# Patient Record
Sex: Male | Born: 1970 | Race: White | Hispanic: No | Smoking: Never smoker
Health system: Southern US, Community
[De-identification: ages and names within clinical notes are randomized; demographics above are authoritative.]

## PROBLEM LIST (undated history)

## (undated) DIAGNOSIS — M1990 Unspecified osteoarthritis, unspecified site: Secondary | ICD-10-CM

## (undated) DIAGNOSIS — F419 Anxiety disorder, unspecified: Secondary | ICD-10-CM

## (undated) DIAGNOSIS — K219 Gastro-esophageal reflux disease without esophagitis: Secondary | ICD-10-CM

## (undated) DIAGNOSIS — I10 Essential (primary) hypertension: Secondary | ICD-10-CM

## (undated) DIAGNOSIS — K5190 Ulcerative colitis, unspecified, without complications: Secondary | ICD-10-CM

## (undated) DIAGNOSIS — G4730 Sleep apnea, unspecified: Secondary | ICD-10-CM

## (undated) HISTORY — DX: Essential (primary) hypertension: I10

## (undated) HISTORY — DX: Anxiety disorder, unspecified: F41.9

## (undated) HISTORY — PX: TOOTH EXTRACTION: SUR596

## (undated) HISTORY — PX: CARDIAC CATHETERIZATION: SHX172

## (undated) HISTORY — DX: Sleep apnea, unspecified: G47.30

## (undated) HISTORY — PX: TONSILLECTOMY AND ADENOIDECTOMY: SUR1326

---

## 2009-07-21 ENCOUNTER — Encounter: Payer: Self-pay | Admitting: Cardiology

## 2009-07-31 ENCOUNTER — Encounter: Payer: Self-pay | Admitting: Cardiology

## 2009-08-21 ENCOUNTER — Emergency Department (HOSPITAL_COMMUNITY): Admission: EM | Admit: 2009-08-21 | Discharge: 2009-08-21 | Payer: Self-pay | Admitting: Emergency Medicine

## 2009-08-21 ENCOUNTER — Encounter: Payer: Self-pay | Admitting: Cardiology

## 2009-09-11 ENCOUNTER — Emergency Department (HOSPITAL_COMMUNITY): Admission: EM | Admit: 2009-09-11 | Discharge: 2009-09-11 | Payer: Self-pay | Admitting: Emergency Medicine

## 2009-09-16 ENCOUNTER — Emergency Department (HOSPITAL_COMMUNITY)
Admission: EM | Admit: 2009-09-16 | Discharge: 2009-09-16 | Payer: Self-pay | Source: Home / Self Care | Admitting: Emergency Medicine

## 2009-09-25 ENCOUNTER — Encounter: Payer: Self-pay | Admitting: Cardiology

## 2009-10-01 ENCOUNTER — Encounter: Payer: Self-pay | Admitting: Cardiology

## 2009-10-06 ENCOUNTER — Encounter: Payer: Self-pay | Admitting: Cardiology

## 2009-10-06 ENCOUNTER — Ambulatory Visit: Payer: Self-pay | Admitting: Cardiology

## 2009-10-06 ENCOUNTER — Emergency Department (HOSPITAL_COMMUNITY): Admission: EM | Admit: 2009-10-06 | Discharge: 2009-10-06 | Payer: Self-pay | Admitting: Emergency Medicine

## 2009-10-19 ENCOUNTER — Encounter (INDEPENDENT_AMBULATORY_CARE_PROVIDER_SITE_OTHER): Payer: Self-pay | Admitting: *Deleted

## 2009-10-19 ENCOUNTER — Ambulatory Visit: Payer: Self-pay | Admitting: Cardiology

## 2009-10-19 DIAGNOSIS — R1013 Epigastric pain: Secondary | ICD-10-CM | POA: Insufficient documentation

## 2009-10-19 DIAGNOSIS — I498 Other specified cardiac arrhythmias: Secondary | ICD-10-CM | POA: Insufficient documentation

## 2009-10-19 DIAGNOSIS — R072 Precordial pain: Secondary | ICD-10-CM | POA: Insufficient documentation

## 2009-10-19 DIAGNOSIS — F411 Generalized anxiety disorder: Secondary | ICD-10-CM | POA: Insufficient documentation

## 2009-10-20 ENCOUNTER — Encounter: Payer: Self-pay | Admitting: Cardiology

## 2009-10-25 ENCOUNTER — Encounter: Payer: Self-pay | Admitting: Cardiology

## 2009-10-25 ENCOUNTER — Ambulatory Visit: Payer: Self-pay | Admitting: Cardiology

## 2009-10-26 ENCOUNTER — Ambulatory Visit: Payer: Self-pay | Admitting: Cardiology

## 2009-10-26 ENCOUNTER — Ambulatory Visit (HOSPITAL_COMMUNITY): Admission: RE | Admit: 2009-10-26 | Discharge: 2009-10-26 | Payer: Self-pay | Admitting: Cardiology

## 2009-11-08 ENCOUNTER — Ambulatory Visit: Payer: Self-pay | Admitting: Cardiology

## 2009-11-11 ENCOUNTER — Encounter: Payer: Self-pay | Admitting: Cardiology

## 2009-11-20 ENCOUNTER — Emergency Department (HOSPITAL_COMMUNITY): Admission: EM | Admit: 2009-11-20 | Discharge: 2009-11-20 | Payer: Self-pay | Admitting: Emergency Medicine

## 2009-11-22 ENCOUNTER — Ambulatory Visit: Payer: Self-pay | Admitting: Cardiology

## 2009-12-21 ENCOUNTER — Ambulatory Visit: Payer: Self-pay | Admitting: Cardiology

## 2009-12-24 ENCOUNTER — Ambulatory Visit: Payer: Self-pay | Admitting: Cardiology

## 2009-12-24 ENCOUNTER — Ambulatory Visit (HOSPITAL_COMMUNITY): Admission: RE | Admit: 2009-12-24 | Discharge: 2009-12-24 | Payer: Self-pay | Admitting: Cardiology

## 2010-01-06 ENCOUNTER — Encounter: Admission: RE | Admit: 2010-01-06 | Discharge: 2010-01-06 | Payer: Self-pay | Admitting: Gastroenterology

## 2010-01-09 ENCOUNTER — Emergency Department (HOSPITAL_COMMUNITY): Admission: EM | Admit: 2010-01-09 | Discharge: 2010-01-09 | Payer: Self-pay | Admitting: Emergency Medicine

## 2010-01-11 ENCOUNTER — Encounter: Payer: Self-pay | Admitting: Gastroenterology

## 2010-01-14 ENCOUNTER — Encounter: Admission: RE | Admit: 2010-01-14 | Discharge: 2010-01-14 | Payer: Self-pay | Admitting: Gastroenterology

## 2010-01-24 ENCOUNTER — Encounter: Admission: RE | Admit: 2010-01-24 | Discharge: 2010-01-24 | Payer: Self-pay | Admitting: Gastroenterology

## 2010-02-09 ENCOUNTER — Ambulatory Visit (HOSPITAL_COMMUNITY): Admission: RE | Admit: 2010-02-09 | Discharge: 2010-02-09 | Payer: Self-pay | Admitting: Gastroenterology

## 2010-03-06 ENCOUNTER — Emergency Department (HOSPITAL_COMMUNITY)
Admission: EM | Admit: 2010-03-06 | Discharge: 2010-03-06 | Payer: Self-pay | Source: Home / Self Care | Admitting: Emergency Medicine

## 2010-05-02 ENCOUNTER — Ambulatory Visit (HOSPITAL_COMMUNITY)
Admission: RE | Admit: 2010-05-02 | Discharge: 2010-05-02 | Payer: Self-pay | Source: Home / Self Care | Attending: Cardiovascular Disease | Admitting: Cardiovascular Disease

## 2010-05-12 NOTE — Op Note (Addendum)
NAME:  Jimmy Durham, Jimmy Durham NO.:  [id number]  MEDICAL RECORD NO.:  [id number]           PATIENT TYPE:  LOCATION:                                 FACILITY:  PHYSICIAN:  Thurmon Fair, MD     DATE OF BIRTH:  03/21/1971  DATE OF PROCEDURE: DATE OF DISCHARGE:                              OPERATIVE REPORT   PREPROCEDURAL DIAGNOSIS:  Recurrent presyncope.  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  PROCEDURE PERFORMED:  Head-up tilt table testing with administration of sublingual nitroglycerin.  SURGEON:  Thurmon Fair, MD  Jimmy Durham is a 40 year old man with multiple complaints including recurrent episodes of dizziness with loss of vision and near-syncope.  The patient was initially monitored in the supine position until achievement of hemodynamic steady state, at which point the blood pressure was 139/93 mmHg and the heart rate was 73 beats per minute. His blood pressure was consistently elevated throughout the supine monitoring.  The patient was then raised to the 70-degree head-up tilt position.  The immediate blood pressure was 139/93 mmHg and the heart rate was 73 beats per minute.  There was no evidence of orthostatic hypotension.  The patient was then monitored in the upright position for about 30 minutes. During this time, there were profound variations in heart rate between 67 and 88 beats per minute with obvious respiratory arrhythmia consistent with high vagal tone.  The blood pressure showed a gradual reduction during the upright position to a minimum of 113/78 mmHg.  He did not have any complaints during this period.  Carotid sinus compression was performed in a sequential fashion on the right and left side.  There was a slight and expected reduction in heart rate with rapid return to baseline levels.  The patient did not complain of any  presyncopal symptoms.  Sublingual nitroglycerin 0.4 mg was then administered.  The patient showed a gradual increase in heart rate to a maximum of 116 beats per minute and a slight decrease in blood pressure to around 109/56 mmHg. He began complaining of sweatiness, flushing, and warmth, and subsequently complained of blackening of his vision.  He subsequently experienced complete brief syncope.  After peeking at a heart rate of 116, he showed a fairly abrupt reduction in heart rate with sinus bradycardia to the range of 40 beats per minute followed by episodes of second-degree atrioventricular block with junctional escape rhythm.  The longest pause recorded was approximately 4.5 seconds and was associated with full-blown syncope.  The lowest blood pressure recorded during this time was 72/27 mmHg.  The patient was returned to the supine position with virtually instantaneous resolution of loss of consciousness.  The blood pressure increased immediately to 105/68 mmHg and the heart rate stayed relatively low around 60 beats per minute for a period of time.  The patient had profound diaphoresis.  He stated that the symptoms he experienced were reminiscent of many, although not all of his clinical events.  CONCLUSION:  Abnormal head-up tilt table test with findings highly consistent with neurally mediated syncope, predominantly with a cardioinhibitory component.  There is also a component of resting hypertension and vasodepressor response.  There is no evidence of immediate orthostatic hypotension.  Treatment should consist of volume and felt loading, but due to the typical rise and fall heart rate he would hopefully have a good response to beta-blockers.  Atenolol 25 mg once a day was prescribed.  It should also help with treatment of systemic hypertension.  Jimmy Durham has numerous other somatic complaints that appeared to not be associated with this diagnosis, it will probably take  [place] some time to sort through his numerous symptoms.     Thurmon Fair, MD     MC/MEDQ  D:  05/03/2010  T:  05/03/2010  Job:  [id number]  cc:   [facility].  Electronically Signed by Thurmon Fair M.D. on 05/12/2010 12:18:06 PM

## 2010-05-17 NOTE — Letter (Signed)
Summary: External Correspondence/ [facility] OFFICE NOTE  External Correspondence/ [facility]ICE NOTE   Imported By: Dorise Hiss 10/08/2009 11:23:00  _____________________________________________________________________  External Attachment:    Type:   Image     Comment:   External Document

## 2010-05-17 NOTE — Letter (Signed)
Summary: External Correspondence/ FAXED PRE-CATH ORDER  External Correspondence/ FAXED PRE-CATH ORDER   Imported By: Dorise Hiss 10/26/2009 09:23:01  _____________________________________________________________________  External Attachment:    Type:   Image     Comment:   External Document

## 2010-05-17 NOTE — Assessment & Plan Note (Signed)
Summary: FOLLOW UP ECHO/CATH/HOLTER   Visit Type:  Follow-up Primary Provider:  Dr. Quintin Alto  CC:  Chest pain and Dyspnea.  History of Present Illness: The patient for follow up of multiple complaints. These are outlined in previous note. He has since had an echocardiogram demonstrating normal left ventricular function and no valvular abnormalities. A Holter monitor demonstrated sinus rhythm with rare PACs. Nothing correlated with symptoms. Catheterization demonstrated normal coronary arteries. Labs were unremarkable. Following the catheterization he said he had knots in his right groin where he had the arterial puncture. He says his thighs and testicles have felt ice cold when sitting or walking. He continues to have the palpitations, chest pain and shortness of breath as previously described.  Preventive Screening-Counseling & Management  Alcohol-Tobacco     Smoking Status: never  Current Medications (verified): 1)  Benadryl 25 Mg Tabs (Diphenhydramine Hcl) .... Take 2 Tab Every 6 Hrs As Needed Allergies 2)  Alprazolam 1 Mg Tabs (Alprazolam) .... Take One Tab At Bedtime As Needed  Allergies: 1)  ! Pcn 2)  ! Codeine 3)  ! Prednisone  Past History:  Past Medical History: Reviewed history from 10/19/2009 and no changes required. Anxiety  Past Surgical History: Reviewed history from 10/19/2009 and no changes required. Tympanostomy tubes Adenoidectomy  Review of Systems       As stated in the HPI and negative for all other systems.   Vital Signs:  Patient profile:   40 year old male Height:      72 inches Weight:      189.50 pounds Pulse rate:   69 / minute BP sitting:   131 / 90  (left arm) Cuff size:   regular  Vitals Entered By: Hoover Brunette, LPN (November 22, 2009 8:49 AM) CC: Chest pain, Dyspnea Is Patient Diabetic? No Comments f/u on echo, cath, monitor   Physical Exam  General:  Well developed, well nourished, in no acute distress. Head:  normocephalic  and atraumatic Neck:  Neck supple, no JVD. No masses, thyromegaly or abnormal cervical nodes. Chest Wall:  no deformities or breast masses noted Lungs:  Clear bilaterally to auscultation and percussion. Heart:  Non-displaced PMI, chest non-tender; regular rate and rhythm, S1, S2 without murmurs, rubs or gallops. Carotid upstroke normal, no bruit. Normal abdominal aortic size, no bruits. Femorals normal pulses, no bruits. Pedals normal pulses. No edema, no varicosities. Abdomen:  Bowel sounds positive; abdomen soft and non-tender without masses, organomegaly, or hernias noted. No hepatosplenomegaly. Msk:  Back normal, normal gait. Muscle strength and tone normal. Extremities:  Right groin without ecchymosis, erythema, bruit or pulsatile mass Neurologic:  Alert and oriented x 3.   Impression & Recommendations:  Problem # 1:  BRADYCARDIA (ICD-427.89) His Holter monitor demonstrated no abnormalities. His minimum heart rate was 42 but this was at sleep. No further workup is suggested.  Problem # 2:  PRECORDIAL PAIN (ICD-786.51) He has had normal cardiovascular studies as listed. There is no cardiac etiology to either his chest pain or his lower extremity complaints. He has normal pulses in his lower extremities and no evidence of problems and the catheterization. He could have a neurological disc problem causing numbness. However, I tried to reassure him, though he is reluctant to believe this, that all of his cardiovascular studies have been normal and no further workup is indicated.  Patient Instructions: 1)  No further cardiac work up needed

## 2010-05-17 NOTE — Letter (Signed)
Summary: Cardiac Cath Instructions - Main Lab  [facility]. [address]   [place], [place] [address]   Phone: [phone]  Fax: [phone]     10/19/2009 MRN: [id number]  Jimmy Durham [address] [place], [place]  [address]  Dear Jimmy Durham,   You are scheduled for Cardiac Catheterization on Tuesday, October 26, 2009   with Dr. Antoine Poche.  Please arrive at the [facility] at 7:30 am on the day of your procedure.  1. DIET     __X__ Nothing to eat or drink after midnight except your medications with a sip of water.  2. Come to the [place] office on             for lab work.  The lab at [facility] is open from 8:30 a.m. to 1:30 p.m. and 2:30 p.m. to 5:00 p.m.  The lab at [facility] is open from 7:30 a.m. to 5:30 p.m.  You do not have to be fasting.  3. MAKE SURE YOU TAKE YOUR ASPIRIN.  4. _____ DO NOT TAKE these medications before your procedure:     5. __X___ YOU MAY TAKE ALL of your remaining medications with a small amount of water.  6._____ START NEW medications:  7._____ Pre-med instructions:     _______________________________________________________________________  8. Plan for one night stay - bring personal belongings (i.e. toothpaste, toothbrush, etc.)  9. Bring a current list of your medications and current insurance cards.  10.Must have a responsible person to drive you home.   11.Someone must be with you for the first 24 hours after you arrive home.  9. Please wear clothes that are easy to get on and off and wear slip-on shoes.  *Special note: Every effort is made to have your procedure done on time.  Occasionally there are emergencies that present themselves at the hospital that may cause delays.  Please be patient if a delay does occur.  If you have any questions after you get home, please call the office at the number listed above.  Cyril Loosen, RN, BSN

## 2010-05-17 NOTE — Procedures (Signed)
Summary: Holter and Event/ [facility]  Holter and Event/ [facility]   Imported By: Dorise Hiss 11/22/2009 14:28:59  _____________________________________________________________________  External Attachment:    Type:   Image     Comment:   External Document

## 2010-05-17 NOTE — Assessment & Plan Note (Signed)
Summary: NP- NORMAL STRESS TEST  & CHEST PAIN   Visit Type:  Initial Consult Primary Provider:  Dr. Quintin Alto  CC:  chest pain.  History of Present Illness: The patient presents with a host of symptoms. He has had chest discomfort since April. This has at times been a constant discomfort. He actually was in the emergency room at [facility] in April with some chest discomfort and nausea. He ruled out. An EKG at that time did demonstrate transient sinus bradycardia with AV dissociation. A followup EKG in early June demonstrated sinus bradycardia with a rate of 50s. He did have a stress perfusion study on 622 demonstrating an EF of 57% with probable apical ischemia. He is now undergoing an evaluation for dysphagia and a 32 pound weight loss. He had an EGD but there were no apparent abnormalities. He is describing multiple symptoms such as throat discomfort with activity or swallowing, left arm numbness and discomfort, palpitations and chest fluttering, heavy weak legs, shortness of breath with minimal exertion. He has had presyncope but no syncope. He denies any PND or orthopnea.  Preventive Screening-Counseling & Management  Alcohol-Tobacco     Smoking Status: never  Current Medications (verified): 1)  Cipro 250 Mg Tabs (Ciprofloxacin Hcl) .... Take 1 Tablet By Mouth Two Times A Day (Has 3 More Weeks Left) 2)  Flagyl 500 Mg Tabs (Metronidazole) .... Take 1 Tablet By Mouth Two Times A Day (Only Has 4 More Days Left) 3)  Ambien 10 Mg Tabs (Zolpidem Tartrate) .... Take 1 Tab By Mouth At Bedtime 4)  Benadryl 25 Mg Tabs (Diphenhydramine Hcl) .... Take 2 Tab Every 6 Hrs As Needed Allergies 5)  Prilosec 20 Mg Cpdr (Omeprazole) .... Take 1 Tablet By Mouth Once A Day  Allergies (verified): 1)  ! Pcn 2)  ! Codeine 3)  ! Prednisone  Past History:  Past Medical History: Anxiety  Past Surgical History: Tympanostomy tubes Adenoidectomy  Family History: Father with HTN, DM.  Mother  unknown. No brothers or sisters Paternal uncles with cardiomyopathy. (Of note the patient's daughter is 6 foot one and his father 6 feet 9)  Social History: Alcohol Use - no Drug Use - no Tobacco never Smoking Status:  never  Review of Systems       Positive for headaches, diaphoresis, cough, abdominal discomfort, hemorrhoids, difficulty urinating. Otherwise as stated in the history of present illness negative for all other systems.  Vital Signs:  Patient profile:   40 year old male Height:      74 inches Weight:      192.50 pounds BMI:     24.80 O2 Sat:      98 % on Room air Pulse rate:   58 / minute BP sitting:   111 / 74  (left arm) Cuff size:   regular  Vitals Entered By: Hoover Brunette, LPN (October 19, 9145 2:18 PM)  O2 Flow:  Room air CC: chest pain Is Patient Diabetic? No Comments chest pain - had first episode on 4/16.  off/on since then - c/o legs feeling heavy and having pain in center of chest going down to left arm.  also, c/o having stiffness in left side of neck   Physical Exam  General:  Well developed, well nourished, in no acute distress. Head:  normocephalic and atraumatic Eyes:  PERRLA/EOM intact; conjunctiva and lids normal. Mouth:  Poor dentition. Otherwise unremarkable. Neck:  Neck supple, no JVD. No masses, thyromegaly or abnormal cervical nodes. Chest Wall:  no deformities or breast masses noted Lungs:  Clear bilaterally to auscultation and percussion. Abdomen:  Bowel sounds positive; abdomen soft and non-tender without masses, organomegaly, or hernias noted. No hepatosplenomegaly. Msk:  Back normal, normal gait. Muscle strength and tone normal. Extremities:  No clubbing or cyanosis. Neurologic:  Alert and oriented x 3. Skin:  Intact without lesions or rashes. Cervical Nodes:  no significant adenopathy Axillary Nodes:  no significant adenopathy Inguinal Nodes:  no significant adenopathy Psych:  Normal affect.   Detailed Cardiovascular  Exam  Neck    Carotids: Carotids full and equal bilaterally without bruits.      Neck Veins: Normal, no JVD.    Heart    Inspection: no deformities or lifts noted.      Palpation: normal PMI with no thrills palpable.      Auscultation: regular rate and rhythm, S1, S2 without murmurs, rubs, gallops, or clicks.    Vascular    Abdominal Aorta: no palpable masses, pulsations, or audible bruits.      Femoral Pulses: normal femoral pulses bilaterally.      Pedal Pulses: normal pedal pulses bilaterally.      Radial Pulses: normal radial pulses bilaterally.      Peripheral Circulation: no clubbing, cyanosis, or edema noted with normal capillary refill.     EKG  Procedure date:  09/25/2009  Findings:      Sinus rhythm, axis within normal limits, intervals within normal limits, no acute ST-T wave changes  Impression & Recommendations:  Problem # 1:  PRECORDIAL PAIN (ICD-786.51)  The patient's chest pain is somewhat atypical. However, he has an abnormal stress perfusion study and very significant dyspnea. He needs a right and left heart catheterization for further evaluation. I have discussed the procedure with the patient. He understands and agrees to proceed. I will further evaluate his dyspnea with an echocardiogram.  Orders: T-Basic Metabolic Panel [id number]) T-CBC No Diff ([id number]) T-PTT ([id number]) T-Protime, Auto ([id number]) Cardiac Catheterization (Cardiac Cath) Holter Monitor (Holter Monitor) 2-D Echocardiogram (2D Echo)  Problem # 2:  BRADYCARDIA (ICD-427.89) The patient had bradycardia and describes symptomatic tachycardia palpitations as well. I will apply a 48-hour Holter monitor.  Problem # 3:  ANXIETY STATE, UNSPECIFIED (ICD-300.00) I suspect that this is playing a role in some of his symptoms but will defer to his primary physician.  Patient Instructions: 1)  Your physician recommends that you go to the [facility] for lab work: DO TODAY OR TOMORROW,  IF POSSIBLE. 2)  Your physician has requested that you have an echocardiogram.  Echocardiography is a painless test that uses sound waves to create images of your heart. It provides your doctor with information about the size and shape of your heart and how well your heart's chambers and valves are working.  This procedure takes approximately one hour. There are no restrictions for this procedure. 3)  Your physician has recommended that you wear a holter monitor.  Holter monitors are medical devices that record the heart's electrical activity. Doctors most often use these monitors to diagnose arrhythmias. Arrhythmias are problems with the speed or rhythm of the heartbeat. The monitor is a small, portable device. You can wear one while you do your normal daily activities. This is usually used to diagnose what is causing palpitations/syncope (passing out). SCHEDULED FOR WEDNESDAY, JULY 20TH AT 9AM. 4)  Your physician has requested that you have a cardiac catheterization.  Cardiac catheterization is used to diagnose and/or treat various heart conditions. Doctors may recommend this  procedure for a number of different reasons. The most common reason is to evaluate chest pain. Chest pain can be a symptom of coronary artery disease (CAD), and cardiac catheterization can show whether plaque is narrowing or blocking your heart's arteries. This procedure is also used to evaluate the valves, as well as measure the blood flow and oxygen levels in different parts of your heart.  For further information please visit [email or web address]  Please follow instruction sheet, as given. 5)  FOLLOW UP WITH DR. Saudia Smyser ON MONDAY, November 22, 2009 AT 11:45AM.

## 2010-06-28 LAB — COMPREHENSIVE METABOLIC PANEL
Albumin: 4 g/dL (ref 3.5–5.2)
BUN: 6 mg/dL (ref 6–23)
Chloride: 102 mEq/L (ref 96–112)
Creatinine, Ser: 0.97 mg/dL (ref 0.4–1.5)
GFR calc non Af Amer: >60 mL/min (ref >60)
Glucose, Bld: 97 mg/dL (ref 70–99)
Total Bilirubin: 1.5 mg/dL — ABNORMAL HIGH (ref 0.3–1.2)

## 2010-06-28 LAB — URINALYSIS, ROUTINE W REFLEX MICROSCOPIC
Bilirubin Urine: NEGATIVE
Hgb urine dipstick: NEGATIVE
Protein, ur: NEGATIVE mg/dL
Specific Gravity, Urine: 1.011 (ref 1.005–1.030)
Urobilinogen, UA: 0.2 mg/dL (ref 0.0–1.0)

## 2010-06-28 LAB — CBC
HCT: 44.2 % (ref 39.0–52.0)
MCH: 29 pg (ref 26.0–34.0)
MCV: 81.7 fL (ref 78.0–100.0)
Platelets: 285 10*3/uL (ref 150–400)
RBC: 5.41 MIL/uL (ref 4.22–5.81)

## 2010-06-28 LAB — DIFFERENTIAL
Basophils Absolute: 0 10*3/uL (ref 0.0–0.1)
Lymphocytes Relative: 29 % (ref 12–46)
Neutro Abs: 5.4 10*3/uL (ref 1.7–7.7)
Neutrophils Relative %: 62 % (ref 43–77)

## 2010-06-30 LAB — HEPATIC FUNCTION PANEL
Bilirubin, Direct: 0.1 mg/dL (ref 0.0–0.3)
Indirect Bilirubin: 1 mg/dL — ABNORMAL HIGH (ref 0.3–0.9)
Total Protein: 7.4 g/dL (ref 6.0–8.3)

## 2010-06-30 LAB — CBC
HCT: 40.3 % (ref 39.0–52.0)
MCHC: 34.6 g/dL (ref 30.0–36.0)
MCV: 82.8 fL (ref 78.0–100.0)
Platelets: 275 10*3/uL (ref 150–400)
RDW: 12.9 % (ref 11.5–15.5)
WBC: 9.2 10*3/uL (ref 4.0–10.5)

## 2010-06-30 LAB — BASIC METABOLIC PANEL
Chloride: 103 mEq/L (ref 96–112)
GFR calc Af Amer: >60 mL/min (ref >60)
GFR calc non Af Amer: >60 mL/min (ref >60)
Potassium: 3.6 mEq/L (ref 3.5–5.1)
Sodium: 139 mEq/L (ref 135–145)

## 2010-06-30 LAB — DIFFERENTIAL
Basophils Absolute: 0 10*3/uL (ref 0.0–0.1)
Basophils Relative: 1 % (ref 0–1)
Eosinophils Relative: 2 % (ref 0–5)
Lymphocytes Relative: 30 % (ref 12–46)
Monocytes Absolute: 0.7 10*3/uL (ref 0.1–1.0)
Neutro Abs: 5.5 10*3/uL (ref 1.7–7.7)

## 2010-07-03 LAB — POCT I-STAT 3, VENOUS BLOOD GAS (G3P V)
Acid-Base Excess: 2 mmol/L (ref 0.0–2.0)
Acid-Base Excess: 2 mmol/L (ref 0.0–2.0)
Bicarbonate: 27.3 mEq/L — ABNORMAL HIGH (ref 20.0–24.0)
Bicarbonate: 28.4 mEq/L — ABNORMAL HIGH (ref 20.0–24.0)
O2 Saturation: 69 %
O2 Saturation: 70 %
TCO2: 29 mmol/L (ref 0–100)
TCO2: 30 mmol/L (ref 0–100)
pCO2, Ven: 46.3 mmHg (ref 45.0–50.0)
pCO2, Ven: 50.4 mmHg — ABNORMAL HIGH (ref 45.0–50.0)
pH, Ven: 7.36 — ABNORMAL HIGH (ref 7.250–7.300)
pH, Ven: 7.378 — ABNORMAL HIGH (ref 7.250–7.300)
pO2, Ven: 38 mmHg (ref 30.0–45.0)
pO2, Ven: 38 mmHg (ref 30.0–45.0)

## 2010-07-03 LAB — URINALYSIS, ROUTINE W REFLEX MICROSCOPIC
Bilirubin Urine: NEGATIVE
Hgb urine dipstick: NEGATIVE
Ketones, ur: NEGATIVE mg/dL
Specific Gravity, Urine: 1.02 (ref 1.005–1.030)
Urobilinogen, UA: 0.2 mg/dL (ref 0.0–1.0)

## 2010-07-03 LAB — POCT I-STAT 3, ART BLOOD GAS (G3+)
Acid-base deficit: 1 mmol/L (ref 0.0–2.0)
Bicarbonate: 25.6 mEq/L — ABNORMAL HIGH (ref 20.0–24.0)
O2 Saturation: 92 %
TCO2: 27 mmol/L (ref 0–100)
pCO2 arterial: 46.8 mmHg — ABNORMAL HIGH (ref 35.0–45.0)
pH, Arterial: 7.346 — ABNORMAL LOW (ref 7.350–7.450)
pO2, Arterial: 68 mmHg — ABNORMAL LOW (ref 80.0–100.0)

## 2010-07-03 LAB — BASIC METABOLIC PANEL
BUN: 5 mg/dL — ABNORMAL LOW (ref 6–23)
CO2: 27 mEq/L (ref 19–32)
Calcium: 9 mg/dL (ref 8.4–10.5)
Creatinine, Ser: 1.02 mg/dL (ref 0.4–1.5)
Glucose, Bld: 99 mg/dL (ref 70–99)
Sodium: 140 mEq/L (ref 135–145)

## 2010-07-04 LAB — URINALYSIS, ROUTINE W REFLEX MICROSCOPIC
Bilirubin Urine: NEGATIVE
Glucose, UA: 100 mg/dL — AB
Glucose, UA: NEGATIVE mg/dL
Hgb urine dipstick: NEGATIVE
Ketones, ur: NEGATIVE mg/dL
Nitrite: NEGATIVE
Protein, ur: NEGATIVE mg/dL

## 2010-07-04 LAB — URINE MICROSCOPIC-ADD ON

## 2010-07-04 LAB — CBC
HCT: 42.8 % (ref 39.0–52.0)
MCHC: 35.1 g/dL (ref 30.0–36.0)
MCV: 84.7 fL (ref 78.0–100.0)
Platelets: 307 10*3/uL (ref 150–400)
RDW: 12.9 % (ref 11.5–15.5)

## 2010-07-04 LAB — COMPREHENSIVE METABOLIC PANEL
ALT: 27 U/L (ref 0–53)
AST: 25 U/L (ref 0–37)
Calcium: 9.1 mg/dL (ref 8.4–10.5)
GFR calc Af Amer: >60 mL/min (ref >60)
Sodium: 142 mEq/L (ref 135–145)
Total Protein: 7.6 g/dL (ref 6.0–8.3)

## 2010-07-04 LAB — POCT CARDIAC MARKERS
CKMB, poc: <1 ng/mL — ABNORMAL LOW (ref 1.0–8.0)
Myoglobin, poc: 52 ng/mL (ref 12–200)
Troponin i, poc: <0.05 ng/mL (ref 0.00–0.09)

## 2010-07-04 LAB — DIFFERENTIAL
Basophils Absolute: 0 10*3/uL (ref 0.0–0.1)
Eosinophils Absolute: 0.4 10*3/uL (ref 0.0–0.7)
Eosinophils Relative: 4 % (ref 0–5)

## 2010-07-05 LAB — POCT CARDIAC MARKERS
CKMB, poc: <1 ng/mL — ABNORMAL LOW (ref 1.0–8.0)
CKMB, poc: <1 ng/mL — ABNORMAL LOW (ref 1.0–8.0)
Troponin i, poc: <0.05 ng/mL (ref 0.00–0.09)
Troponin i, poc: <0.05 ng/mL (ref 0.00–0.09)

## 2010-07-05 LAB — CBC
MCHC: 35 g/dL (ref 30.0–36.0)
Platelets: 275 10*3/uL (ref 150–400)
RBC: 5.14 MIL/uL (ref 4.22–5.81)

## 2010-07-05 LAB — DIFFERENTIAL
Eosinophils Absolute: 0.6 10*3/uL (ref 0.0–0.7)
Eosinophils Relative: 7 % — ABNORMAL HIGH (ref 0–5)
Lymphs Abs: 2.1 10*3/uL (ref 0.7–4.0)
Monocytes Absolute: 0.6 10*3/uL (ref 0.1–1.0)
Monocytes Relative: 8 % (ref 3–12)

## 2010-07-05 LAB — COMPREHENSIVE METABOLIC PANEL
ALT: 28 U/L (ref 0–53)
AST: 24 U/L (ref 0–37)
Albumin: 4.1 g/dL (ref 3.5–5.2)
CO2: 26 mEq/L (ref 19–32)
Calcium: 8.5 mg/dL (ref 8.4–10.5)
GFR calc Af Amer: >60 mL/min (ref >60)
Sodium: 140 mEq/L (ref 135–145)
Total Protein: 7.5 g/dL (ref 6.0–8.3)

## 2010-09-19 ENCOUNTER — Ambulatory Visit: Payer: Self-pay | Admitting: Physical Medicine & Rehabilitation

## 2010-09-27 ENCOUNTER — Ambulatory Visit: Payer: Self-pay | Admitting: Physical Medicine & Rehabilitation

## 2010-10-25 ENCOUNTER — Ambulatory Visit: Payer: Self-pay | Admitting: Physical Medicine & Rehabilitation

## 2010-10-25 ENCOUNTER — Encounter: Payer: Self-pay | Attending: Physical Medicine & Rehabilitation

## 2011-04-21 ENCOUNTER — Ambulatory Visit: Payer: Self-pay | Admitting: Urology

## 2011-05-04 ENCOUNTER — Ambulatory Visit (INDEPENDENT_AMBULATORY_CARE_PROVIDER_SITE_OTHER): Payer: Self-pay | Admitting: Internal Medicine

## 2011-05-08 ENCOUNTER — Ambulatory Visit (INDEPENDENT_AMBULATORY_CARE_PROVIDER_SITE_OTHER): Payer: Medicaid Other | Admitting: Internal Medicine

## 2011-05-08 ENCOUNTER — Encounter (INDEPENDENT_AMBULATORY_CARE_PROVIDER_SITE_OTHER): Payer: Self-pay | Admitting: *Deleted

## 2011-05-08 ENCOUNTER — Encounter (INDEPENDENT_AMBULATORY_CARE_PROVIDER_SITE_OTHER): Payer: Self-pay | Admitting: Internal Medicine

## 2011-05-08 DIAGNOSIS — R109 Unspecified abdominal pain: Secondary | ICD-10-CM

## 2011-05-08 DIAGNOSIS — I10 Essential (primary) hypertension: Secondary | ICD-10-CM

## 2011-05-08 DIAGNOSIS — K921 Melena: Secondary | ICD-10-CM

## 2011-05-08 NOTE — Patient Instructions (Signed)
CT abdomen/pelvis with CM. CBC and CMet

## 2011-05-08 NOTE — Progress Notes (Signed)
Subjective:     Patient ID: Jimmy Durham, male   DOB: 03/04/1971, 41 y.o.   MRN: [id number]  HPI  Jimmy Durham is a 41 yr old male presenting today with c/o of flat, black BMs.  He also says he also has problems with constipation. He takes Miralax on a daily basis.  He says if he goes to [employer] and eats he will swell in his left upper abdomen.  He tells me his last black stool was a few weeks ago.  He says he has not had a regular stool in over 2 yrs. His stools now are burgundy red color.  He is having 1 every week or every 2 weeks.  He also c/o bloating.  He also has nausea and vomiting.   He black stools started a few weeks ago.   03/06/2010 H nad H 15.7 and 44.2,  Albumin 4.0, AST 21, ALT 22, ALP 103.  Two years ago he went to [facility] and swallowed a pill (endoscopic pill)  No results. ? Dr. He also had a colonoscopy one year ago at [facility] which revealed chronic gastritis and fissures from old ulcers.  He was referred by Dr Karilyn Cota.   Colonoscopy/EGD 02/09/2010 by Dr. 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  Benign appearing distal esophageal nodule, biopsied. Mild gastritis. Otherwise normal endoscopy to the third portion of the duodenum. Normal colonoscopy to the terminal ileum. No endoscopic finding were seen to explain the patient's symptoms. Suspect patient has some radicular discomfort from his herniated disk or even a chronic regional pain syndrome. ( Epigastric predominant abdominal pain as well as black stools, alternating with bright red blood per rectum.  Appetite is not good.He has lost 20 pounds unintentionally.  Biopsy: 1. Duodenum, biopsy, normal : - BENIGN SMALL BOWEL MUCOSA. - NO ACTIVE INFLAMMATION OR VILLOUS ATROPHY IDENTIFIED.  2. Stomach, biopsy, : - CHRONIC GASTRITIS. - THERE IS NO EVIDENCE OF HELICOBACTER PYLORI, INTESTINAL METAPLASIA, DYSPLASIA OR MALIGNANCY. - SEE COMMENT. 3. Esophagus, biopsy, nodule : - INFLAMED SQUAMOUS PAPILLOMA. -  MALIGNANT FEATURES ARE NOT IDENTIFIED. 4. Esophagus, biopsy, distal : - GASTROESOPHAGEAL JUNCTION MUCOSA WITH MILD INFLAMMATION CONSISTENT WITH GASTROESOPHAGEAL REFLUX. - THERE IS NO EVIDENCE OF INTESTINAL METAPLASIA, DYSPLASIA OR MALIGNANCY. - SEE COMMENT. 5. Colon, biopsy, random : - BENIGN COLONIC MUCOSA. - NO SIGNIFICANT INFLAMMATION OR OTHER ABNORMALITIES IDENTIFIED.  ELECTRONIC SIGNATURE : Geanie Berlin, Ivin Booty, Pathologist, Electronic Signature    Ct abdomen/pelvis with CM 01/2010:   IMPRESSION:  No abnormality evident within the bowel. No evidence mucosal  thickening, obstruction, or mass.  Provider: Gwynneth Munson     Colonoscopy 11/05/09 Dr. Karilyn Cota: Normal terminal ileoscopy and colonoscopy. The patient's symptoms remained unexplain   10/25/2009: Dr. Gabriel Cirri: EGD Normal. (Hemocult + stools, weight loss, epgiastric pain)  Review of Systems  See hpi     Objective:   Physical Exam Filed Vitals:   05/08/11 1506  Height: 6\' 1"  (1.854 m)  Weight: 214 lb (97.07 kg)    Alert and oriented. Skin warm and dry. Oral mucosa is moist.   . Sclera anicteric, conjunctivae is pink. Thyroid not enlarged. No cervical lymphadenopathy. Lungs clear. Heart regular rate and rhythm.  Abdomen is soft. Bowel sounds are positive. No hepatomegaly. No abdominal masses felt. No tenderness. Stool brown and guaiac negative.   No edema to lower extremities. Patient is alert and oriented.     Assessment:    Epigastric pain., Melena.  Will discuss with Dr. Karilyn Cota. (Discussed)  Plan:    CT abdomen/pelvis with CM. Cmet and CBC. Further recommendations once we have the results.

## 2011-05-09 LAB — COMPREHENSIVE METABOLIC PANEL
ALT: 31 U/L (ref 0–53)
Albumin: 4.4 g/dL (ref 3.5–5.2)
CO2: 29 mEq/L (ref 19–32)
Calcium: 9.7 mg/dL (ref 8.4–10.5)
Chloride: 101 mEq/L (ref 96–112)
Potassium: 4.7 mEq/L (ref 3.5–5.3)
Sodium: 138 mEq/L (ref 135–145)
Total Bilirubin: 0.6 mg/dL (ref 0.3–1.2)
Total Protein: 7.2 g/dL (ref 6.0–8.3)

## 2011-05-09 LAB — CBC WITH DIFFERENTIAL/PLATELET
Hemoglobin: 14 g/dL (ref 13.0–17.0)
Lymphocytes Relative: 42 % (ref 12–46)
Lymphs Abs: 3.3 10*3/uL (ref 0.7–4.0)
Neutro Abs: 3.6 10*3/uL (ref 1.7–7.7)
Neutrophils Relative %: 46 % (ref 43–77)
Platelets: 326 10*3/uL (ref 150–400)
RBC: 4.74 MIL/uL (ref 4.22–5.81)
WBC: 7.8 10*3/uL (ref 4.0–10.5)

## 2011-05-19 ENCOUNTER — Ambulatory Visit: Payer: Medicaid Other | Admitting: Urology

## 2011-08-22 ENCOUNTER — Encounter (INDEPENDENT_AMBULATORY_CARE_PROVIDER_SITE_OTHER): Payer: Self-pay

## 2011-09-12 ENCOUNTER — Encounter (INDEPENDENT_AMBULATORY_CARE_PROVIDER_SITE_OTHER): Payer: Self-pay

## 2011-10-03 ENCOUNTER — Emergency Department (HOSPITAL_COMMUNITY)
Admission: EM | Admit: 2011-10-03 | Discharge: 2011-10-03 | Discharge status: Against Medical Advice | Payer: Medicaid Other | Attending: Emergency Medicine | Admitting: Emergency Medicine

## 2011-10-03 ENCOUNTER — Encounter (HOSPITAL_COMMUNITY): Payer: Self-pay

## 2011-10-03 DIAGNOSIS — R1012 Left upper quadrant pain: Secondary | ICD-10-CM | POA: Insufficient documentation

## 2011-10-03 HISTORY — DX: Ulcerative colitis, unspecified, without complications: K51.90

## 2011-10-03 NOTE — ED Notes (Signed)
Pt reports has history of ulcerative colitis and has had blood in stools x 4 years.  Pt says today started burning in LUQ and has been having some black stools.  Pt says has to take miralax several times a day to have a bm.

## 2011-10-03 NOTE — ED Notes (Signed)
Call patient for room placement. Patient not in waiting area. Was told patient left.

## 2011-10-03 NOTE — ED Notes (Signed)
Pt reports feels tired but denies any dizziness or light headedness.

## 2011-12-26 ENCOUNTER — Encounter: Payer: Self-pay | Admitting: Cardiology

## 2012-05-14 ENCOUNTER — Ambulatory Visit: Payer: Medicaid Other | Admitting: Urology

## 2012-05-21 ENCOUNTER — Ambulatory Visit (INDEPENDENT_AMBULATORY_CARE_PROVIDER_SITE_OTHER): Payer: Medicaid Other | Admitting: Urology

## 2012-05-21 DIAGNOSIS — K4090 Unilateral inguinal hernia, without obstruction or gangrene, not specified as recurrent: Secondary | ICD-10-CM

## 2012-05-21 DIAGNOSIS — R109 Unspecified abdominal pain: Secondary | ICD-10-CM

## 2012-05-21 DIAGNOSIS — R39198 Other difficulties with micturition: Secondary | ICD-10-CM

## 2012-06-06 NOTE — H&P (Signed)
  NTS SOAP Note  Vital Signs:  Vitals as of: 06/06/2012: Systolic 136: Diastolic 91: Heart Rate 73: Temp 98.24F: Height 36ft 1in: Weight 208Lbs 0 Ounces: Pain Level 8: BMI 27  BMI : 27.44 kg/m2  Subjective: This 42 Years 66 Months old Male presents forsymptoms of    HERNIA: ,Was seen by Urologist recently for GU issues, found to have bilateral inguinal hernias.  Patient states he primarily has pain in left groin, though right side hurts on occassion.  Bulging made worse with straining.  Also has periumbiilcal pain.  Review of Symptoms:  Constitutional:  fatigue,insomnia  Head:unremarkable    Eyes:unremarkable   Nose/Mouth/Throat:unremarkable     chest pressure Respiratory:  dyspnea Gastrointestin    abdominal pain,nausea,vomiting,heartburn Genitourinary:    dysuria,urinary hesitancy   back, neck, joint pain Skin:unremarkable Hematolgic/Lymphatic:unremarkable       hay fever   Past Medical History:    Reviewed   Past Medical History  Surgical History: cardiac cath, tonsillectomy Medical Problems:  High cholesterol Psychiatric History:  Anxiety Allergies: prednisone, trazadone, codeine, PCN Medications: simvastatin, xanax, norco   Social History:Reviewed  Social History  Preferred Language: English Race:  White Ethnicity: Other Age: 42 Years 10 Months Marital Status:  M Alcohol: beer rarely Recreational drug(s):  No   Smoking Status: Never smoker reviewed on 06/06/2012 Functional Status reviewed on mm/dd/yyyy ------------------------------------------------ Bathing: Normal Cooking: Normal Dressing: Normal Driving: Normal Eating: Normal Managing Meds: Normal Oral Care: Normal Shopping: Normal Toileting: Normal Transferring: Normal Walking: Normal Cognitive Status reviewed on mm/dd/yyyy ------------------------------------------------ Attention: Normal Decision Making: Normal Language: Normal Memory:  Normal Motor: Normal Perception: Normal Problem Solving: Normal Visual and Spatial: Normal   Family History:  Reviewed   Family History  Is there a family history ZH:YQMVH cancer, DM, HTN, kidney problems    Objective Information: General:  Well appearing, well nourished in no distress. Neck:  Supple without lymphadenopathy.  Heart:  RRR, no murmur or gallop.  Normal S1, S2.  No S3, S4.  Lungs:    CTA bilaterally, no wheezes, rhonchi, rales.  Breathing unlabored. Abdomen:Soft, NT/ND, no HSM, no masses.  Bilateral easily reducible inguinal hernias, left >> right.  Small umbilical hernia. QI:[id number]    Assessment:Symptomatic left inguinal hernia, umbilical hernia, right inguinal hernia  Diagnosis &amp; Procedure:    Plan:  Will proceed with left inguinal herniorrhaphy, umbilical herniorrhaphy on 06/10/12.  Will fix right inguinal hernia at future date as needed.   Patient Education:Alternative treatments to surgery were discussed with patient (and family).  Risks and benefits  of procedure including bleeding, infection, pain, and recurrence of the hernias were fully explained to the patient (and family) who gave informed consent. Patient/family questions were addressed.  Follow-up:Pending Surgery

## 2012-06-06 NOTE — Patient Instructions (Signed)
Jimmy Durham  06/06/2012   Your procedure is scheduled on:  06/10/12  Report to [facility] at 1030 AM.  Call this number if you have problems the morning of surgery: [phone]   Remember:   Do not eat food or drink liquids after midnight.   Take these medicines the morning of surgery with A SIP OF WATER: xanax, lisinopril   Do not wear jewelry, make-up or nail polish.  Do not wear lotions, powders, or perfumes. You may wear deodorant.  Do not shave 48 hours prior to surgery. Men may shave face and neck.  Do not bring valuables to the hospital.  Contacts, dentures or bridgework may not be worn into surgery.  Leave suitcase in the car. After surgery it may be brought to your room.  For patients admitted to the hospital, checkout time is 11:00 AM the day of  discharge.   Patients discharged the day of surgery will not be allowed to drive  home.  Name and phone number of your driver: family  Special Instructions: Shower using CHG 2 nights before surgery and the night before surgery.  If you shower the day of surgery use CHG.  Use special wash - you have one bottle of CHG for all showers.  You should use approximately 1/3 of the bottle for each shower.   Please read over the following fact sheets that you were given: Pain Booklet, MRSA Information, Surgical Site Infection Prevention, Anesthesia Post-op Instructions and Care and Recovery After Surgery   PATIENT INSTRUCTIONS POST-ANESTHESIA  IMMEDIATELY FOLLOWING SURGERY:  Do not drive or operate machinery for the first twenty four hours after surgery.  Do not make any important decisions for twenty four hours after surgery or while taking narcotic pain medications or sedatives.  If you develop intractable nausea and vomiting or a severe headache please notify your doctor immediately.  FOLLOW-UP:  Please make an appointment with your surgeon as instructed. You do not need to follow up with anesthesia unless specifically instructed to do  so.  WOUND CARE INSTRUCTIONS (if applicable):  Keep a dry clean dressing on the anesthesia/puncture wound site if there is drainage.  Once the wound has quit draining you may leave it open to air.  Generally you should leave the bandage intact for twenty four hours unless there is drainage.  If the epidural site drains for more than 36-48 hours please call the anesthesia department.  QUESTIONS?:  Please feel free to call your physician or the hospital operator if you have any questions, and they will be happy to assist you.      Inguinal Hernia, Adult Muscles help keep everything in the body in its proper place. But if a weak spot in the muscles develops, something can poke through. That is called a hernia. When this happens in the lower part of the belly (abdomen), it is called an inguinal hernia. (It takes its name from a part of the body in this region called the inguinal canal.) A weak spot in the wall of muscles lets some fat or part of the small intestine bulge through. An inguinal hernia can develop at any age. Men get them more often than women. CAUSES  In adults, an inguinal hernia develops over time.  It can be triggered by:  Suddenly straining the muscles of the lower abdomen.  Lifting heavy objects.  Straining to have a bowel movement. Difficult bowel movements (constipation) can lead to this.  Constant coughing. This may be caused by  smoking or lung disease.  Being overweight.  Being pregnant.  Working at a job that requires long periods of standing or heavy lifting.  Having had an inguinal hernia before. One type can be an emergency situation. It is called a strangulated inguinal hernia. It develops if part of the small intestine slips through the weak spot and cannot get back into the abdomen. The blood supply can be cut off. If that happens, part of the intestine may die. This situation requires emergency surgery. SYMPTOMS  Often, a small inguinal hernia has no  symptoms. It is found when a healthcare provider does a physical exam. Larger hernias usually have symptoms.   In adults, symptoms may include:  A lump in the groin. This is easier to see when the person is standing. It might disappear when lying down.  In men, a lump in the scrotum.  Pain or burning in the groin. This occurs especially when lifting, straining or coughing.  A dull ache or feeling of pressure in the groin.  Signs of a strangulated hernia can include:  A bulge in the groin that becomes very painful and tender to the touch.  A bulge that turns red or purple.  Fever, nausea and vomiting.  Inability to have a bowel movement or to pass gas. DIAGNOSIS  To decide if you have an inguinal hernia, a healthcare provider will probably do a physical examination.  This will include asking questions about any symptoms you have noticed.  The healthcare provider might feel the groin area and ask you to cough. If an inguinal hernia is felt, the healthcare provider may try to slide it back into the abdomen.  Usually no other tests are needed. TREATMENT  Treatments can vary. The size of the hernia makes a difference. Options include:  Watchful waiting. This is often suggested if the hernia is small and you have had no symptoms.  No medical procedure will be done unless symptoms develop.  You will need to watch closely for symptoms. If any occur, contact your healthcare provider right away.  Surgery. This is used if the hernia is larger or you have symptoms.  Open surgery. This is usually an outpatient procedure (you will not stay overnight in a hospital). An cut (incision) is made through the skin in the groin. The hernia is put back inside the abdomen. The weak area in the muscles is then repaired by herniorrhaphy or hernioplasty. Herniorrhaphy: in this type of surgery, the weak muscles are sewn back together. Hernioplasty: a patch or mesh is used to close the weak area in the  abdominal wall.  Laparoscopy. In this procedure, a surgeon makes small incisions. A thin tube with a tiny video camera (called a laparoscope) is put into the abdomen. The surgeon repairs the hernia with mesh by looking with the video camera and using two long instruments. HOME CARE INSTRUCTIONS   After surgery to repair an inguinal hernia:  You will need to take pain medicine prescribed by your healthcare provider. Follow all directions carefully.  You will need to take care of the wound from the incision.  Your activity will be restricted for awhile. This will probably include no heavy lifting for several weeks. You also should not do anything too active for a few weeks. When you can return to work will depend on the type of job that you have.  During "watchful waiting" periods, you should:  Maintain a healthy weight.  Eat a diet high in fiber (fruits, vegetables  and whole grains).  Drink plenty of fluids to avoid constipation. This means drinking enough water and other liquids to keep your urine clear or pale yellow.  Do not lift heavy objects.  Do not stand for long periods of time.  Quit smoking. This should keep you from developing a frequent cough. SEEK MEDICAL CARE IF:   A bulge develops in your groin area.  You feel pain, a burning sensation or pressure in the groin. This might be worse if you are lifting or straining.  You develop a fever of more than 100.5 F (38.1 C). SEEK IMMEDIATE MEDICAL CARE IF:   Pain in the groin increases suddenly.  A bulge in the groin gets bigger suddenly and does not go down.  For men, there is sudden pain in the scrotum. Or, the size of the scrotum increases.  A bulge in the groin area becomes red or purple and is painful to touch.  You have nausea or vomiting that does not go away.  You feel your heart beating much faster than normal.  You cannot have a bowel movement or pass gas.  You develop a fever of more than 102.0 F  (38.9 C). Document Released: 08/20/2008 Document Revised: 06/26/2011 Document Reviewed: 08/20/2008 [facility] Patient Information 2013 [place], [place].

## 2012-06-07 ENCOUNTER — Encounter (HOSPITAL_COMMUNITY): Payer: Self-pay | Admitting: Pharmacy Technician

## 2012-06-07 ENCOUNTER — Encounter (HOSPITAL_COMMUNITY)
Admission: RE | Admit: 2012-06-07 | Discharge: 2012-06-07 | Disposition: A | Discharge status: Home / Self Care | Payer: Medicaid Other | Source: Ambulatory Visit | Attending: General Surgery | Admitting: General Surgery

## 2012-06-07 ENCOUNTER — Other Ambulatory Visit: Payer: Self-pay

## 2012-06-07 ENCOUNTER — Encounter (HOSPITAL_COMMUNITY): Payer: Self-pay

## 2012-06-07 HISTORY — DX: Gastro-esophageal reflux disease without esophagitis: K21.9

## 2012-06-07 HISTORY — DX: Unspecified osteoarthritis, unspecified site: M19.90

## 2012-06-07 LAB — BASIC METABOLIC PANEL
Calcium: 9.5 mg/dL (ref 8.4–10.5)
GFR calc Af Amer: 90 mL/min — ABNORMAL LOW (ref >90)
GFR calc non Af Amer: 77 mL/min — ABNORMAL LOW (ref >90)
Potassium: 4.3 mEq/L (ref 3.5–5.1)
Sodium: 138 mEq/L (ref 135–145)

## 2012-06-07 LAB — CBC WITH DIFFERENTIAL/PLATELET
Basophils Relative: 0 % (ref 0–1)
Eosinophils Absolute: 0.2 10*3/uL (ref 0.0–0.7)
Hemoglobin: 14.4 g/dL (ref 13.0–17.0)
Lymphs Abs: 2.3 10*3/uL (ref 0.7–4.0)
MCH: 29.1 pg (ref 26.0–34.0)
Monocytes Relative: 8 % (ref 3–12)
Neutro Abs: 4.1 10*3/uL (ref 1.7–7.7)
Neutrophils Relative %: 57 % (ref 43–77)
Platelets: 296 10*3/uL (ref 150–400)
RBC: 4.94 MIL/uL (ref 4.22–5.81)
WBC: 7.1 10*3/uL (ref 4.0–10.5)

## 2012-06-07 LAB — SURGICAL PCR SCREEN
MRSA, PCR: NEGATIVE
Staphylococcus aureus: NEGATIVE

## 2012-06-10 ENCOUNTER — Encounter (HOSPITAL_COMMUNITY): Payer: Self-pay | Admitting: Anesthesiology

## 2012-06-10 ENCOUNTER — Encounter (HOSPITAL_COMMUNITY): Payer: Self-pay | Admitting: *Deleted

## 2012-06-10 ENCOUNTER — Ambulatory Visit (HOSPITAL_COMMUNITY): Payer: Medicaid Other | Admitting: Anesthesiology

## 2012-06-10 ENCOUNTER — Encounter (HOSPITAL_COMMUNITY)
Admission: RE | Disposition: A | Discharge status: Home / Self Care | Payer: Self-pay | Source: Ambulatory Visit | Attending: General Surgery

## 2012-06-10 ENCOUNTER — Ambulatory Visit (HOSPITAL_COMMUNITY)
Admission: RE | Admit: 2012-06-10 | Discharge: 2012-06-10 | Disposition: A | Discharge status: Home / Self Care | Payer: Medicaid Other | Source: Ambulatory Visit | Attending: General Surgery | Admitting: General Surgery

## 2012-06-10 DIAGNOSIS — K429 Umbilical hernia without obstruction or gangrene: Secondary | ICD-10-CM | POA: Insufficient documentation

## 2012-06-10 DIAGNOSIS — I10 Essential (primary) hypertension: Secondary | ICD-10-CM | POA: Insufficient documentation

## 2012-06-10 DIAGNOSIS — K4090 Unilateral inguinal hernia, without obstruction or gangrene, not specified as recurrent: Secondary | ICD-10-CM | POA: Insufficient documentation

## 2012-06-10 DIAGNOSIS — E7800 Pure hypercholesterolemia, unspecified: Secondary | ICD-10-CM | POA: Insufficient documentation

## 2012-06-10 DIAGNOSIS — Z01810 Encounter for preprocedural cardiovascular examination: Secondary | ICD-10-CM | POA: Insufficient documentation

## 2012-06-10 DIAGNOSIS — Z01812 Encounter for preprocedural laboratory examination: Secondary | ICD-10-CM | POA: Insufficient documentation

## 2012-06-10 HISTORY — PX: INGUINAL HERNIA REPAIR: SHX194

## 2012-06-10 HISTORY — PX: UMBILICAL HERNIA REPAIR: SHX196

## 2012-06-10 SURGERY — REPAIR, HERNIA, INGUINAL, ADULT
Anesthesia: General | Site: Groin | Wound class: Clean

## 2012-06-10 MED ORDER — CHLORHEXIDINE GLUCONATE 4 % EX LIQD: 1.0000 "application " | Freq: Once | CUTANEOUS | Status: DC

## 2012-06-10 MED ORDER — BUPIVACAINE HCL (PF) 0.5 % IJ SOLN
INTRAMUSCULAR | Status: AC
  Filled 2012-06-10: qty 30

## 2012-06-10 MED ORDER — ONDANSETRON HCL 4 MG/2ML IJ SOLN
INTRAMUSCULAR | Status: AC
  Filled 2012-06-10: qty 2

## 2012-06-10 MED ORDER — MIDAZOLAM HCL 2 MG/2ML IJ SOLN
1.0000 mg | INTRAMUSCULAR | Status: DC | PRN
  Administered 2012-06-10 (×2): 2 mg via INTRAVENOUS

## 2012-06-10 MED ORDER — LACTATED RINGERS IV SOLN
INTRAVENOUS | Status: DC
  Administered 2012-06-10: 12:00:00 via INTRAVENOUS

## 2012-06-10 MED ORDER — POVIDONE-IODINE 10 % OINT PACKET
TOPICAL_OINTMENT | CUTANEOUS | Status: DC | PRN
  Administered 2012-06-10: 1 via TOPICAL

## 2012-06-10 MED ORDER — GLYCOPYRROLATE 0.2 MG/ML IJ SOLN
INTRAMUSCULAR | Status: AC
  Filled 2012-06-10: qty 3

## 2012-06-10 MED ORDER — ONDANSETRON HCL 4 MG/2ML IJ SOLN: 4.0000 mg | Freq: Once | INTRAMUSCULAR | Status: DC | PRN

## 2012-06-10 MED ORDER — SUCCINYLCHOLINE CHLORIDE 20 MG/ML IJ SOLN
INTRAMUSCULAR | Status: DC | PRN
  Administered 2012-06-10: 120 mg via INTRAVENOUS

## 2012-06-10 MED ORDER — POVIDONE-IODINE 10 % EX OINT
TOPICAL_OINTMENT | CUTANEOUS | Status: AC
  Filled 2012-06-10: qty 1

## 2012-06-10 MED ORDER — FENTANYL CITRATE 0.05 MG/ML IJ SOLN
25.0000 ug | INTRAMUSCULAR | Status: DC | PRN
  Administered 2012-06-10 (×4): 50 ug via INTRAVENOUS

## 2012-06-10 MED ORDER — MIDAZOLAM HCL 2 MG/2ML IJ SOLN
2.0000 mg | Freq: Once | INTRAMUSCULAR | Status: AC
  Administered 2012-06-10: 2 mg via INTRAVENOUS

## 2012-06-10 MED ORDER — PROPOFOL 10 MG/ML IV BOLUS
INTRAVENOUS | Status: DC | PRN
Start: 2012-06-10 — End: 2012-06-10
  Administered 2012-06-10: 200 mg via INTRAVENOUS

## 2012-06-10 MED ORDER — NEOSTIGMINE METHYLSULFATE 1 MG/ML IJ SOLN
INTRAMUSCULAR | Status: DC | PRN
  Administered 2012-06-10: 2 mg via INTRAVENOUS

## 2012-06-10 MED ORDER — LIDOCAINE HCL (CARDIAC) 20 MG/ML IV SOLN
INTRAVENOUS | Status: DC | PRN
  Administered 2012-06-10: 50 mg via INTRAVENOUS

## 2012-06-10 MED ORDER — ROCURONIUM BROMIDE 100 MG/10ML IV SOLN
INTRAVENOUS | Status: DC | PRN
  Administered 2012-06-10: 5 mg via INTRAVENOUS
  Administered 2012-06-10: 20 mg via INTRAVENOUS
  Administered 2012-06-10: 25 mg via INTRAVENOUS

## 2012-06-10 MED ORDER — ROCURONIUM BROMIDE 50 MG/5ML IV SOLN
INTRAVENOUS | Status: AC
  Filled 2012-06-10: qty 1

## 2012-06-10 MED ORDER — SUCCINYLCHOLINE CHLORIDE 20 MG/ML IJ SOLN
INTRAMUSCULAR | Status: AC
  Filled 2012-06-10: qty 1

## 2012-06-10 MED ORDER — MIDAZOLAM HCL 2 MG/2ML IJ SOLN
INTRAMUSCULAR | Status: AC
  Filled 2012-06-10: qty 2

## 2012-06-10 MED ORDER — LIDOCAINE HCL (PF) 1 % IJ SOLN
INTRAMUSCULAR | Status: AC
  Filled 2012-06-10: qty 5

## 2012-06-10 MED ORDER — VANCOMYCIN HCL IN DEXTROSE 1-5 GM/200ML-% IV SOLN: 1000.0000 mg | INTRAVENOUS | Status: DC

## 2012-06-10 MED ORDER — FENTANYL CITRATE 0.05 MG/ML IJ SOLN
INTRAMUSCULAR | Status: DC | PRN
  Administered 2012-06-10 (×6): 50 ug via INTRAVENOUS

## 2012-06-10 MED ORDER — FENTANYL CITRATE 0.05 MG/ML IJ SOLN
INTRAMUSCULAR | Status: AC
  Filled 2012-06-10: qty 2

## 2012-06-10 MED ORDER — FENTANYL CITRATE 0.05 MG/ML IJ SOLN
INTRAMUSCULAR | Status: AC
  Filled 2012-06-10: qty 5

## 2012-06-10 MED ORDER — VANCOMYCIN HCL IN DEXTROSE 1-5 GM/200ML-% IV SOLN
INTRAVENOUS | Status: AC
  Filled 2012-06-10: qty 200

## 2012-06-10 MED ORDER — ONDANSETRON HCL 4 MG/2ML IJ SOLN
4.0000 mg | Freq: Once | INTRAMUSCULAR | Status: AC
  Administered 2012-06-10: 4 mg via INTRAVENOUS

## 2012-06-10 MED ORDER — GLYCOPYRROLATE 0.2 MG/ML IJ SOLN
INTRAMUSCULAR | Status: DC | PRN
  Administered 2012-06-10: .4 mg via INTRAVENOUS

## 2012-06-10 MED ORDER — BUPIVACAINE HCL (PF) 0.5 % IJ SOLN
INTRAMUSCULAR | Status: DC | PRN
  Administered 2012-06-10 (×2): 10 mL

## 2012-06-10 MED ORDER — ENOXAPARIN SODIUM 40 MG/0.4ML ~~LOC~~ SOLN
SUBCUTANEOUS | Status: AC
  Filled 2012-06-10: qty 0.4

## 2012-06-10 MED ORDER — SODIUM CHLORIDE 0.9 % IR SOLN
Status: DC | PRN
  Administered 2012-06-10: 1000 mL

## 2012-06-10 MED ORDER — ENOXAPARIN SODIUM 40 MG/0.4ML ~~LOC~~ SOLN
40.0000 mg | Freq: Once | SUBCUTANEOUS | Status: AC
  Administered 2012-06-10: 40 mg via SUBCUTANEOUS

## 2012-06-10 MED ORDER — PROPOFOL 10 MG/ML IV EMUL
INTRAVENOUS | Status: AC
  Filled 2012-06-10: qty 20

## 2012-06-10 MED ORDER — OXYCODONE-ACETAMINOPHEN 7.5-325 MG PO TABS: 1.0000 | ORAL_TABLET | ORAL | Status: DC | PRN

## 2012-06-10 SURGICAL SUPPLY — 43 items
BAG HAMPER (MISCELLANEOUS) ×3 IMPLANT
BLADE SURG SZ11 CARB STEEL (BLADE) ×3 IMPLANT
CLOTH BEACON ORANGE TIMEOUT ST (SAFETY) ×3 IMPLANT
COVER LIGHT HANDLE STERIS (MISCELLANEOUS) ×6 IMPLANT
DECANTER SPIKE VIAL GLASS SM (MISCELLANEOUS) ×3 IMPLANT
DERMABOND ADVANCED (GAUZE/BANDAGES/DRESSINGS) ×1
DERMABOND ADVANCED .7 DNX12 (GAUZE/BANDAGES/DRESSINGS) ×2 IMPLANT
DRAIN PENROSE 18X.75 LTX STRL (MISCELLANEOUS) ×3 IMPLANT
DURAPREP 26ML APPLICATOR (WOUND CARE) ×3 IMPLANT
ELECT NEEDLE TIP 2.8 STRL (NEEDLE) ×3 IMPLANT
ELECT REM PT RETURN 9FT ADLT (ELECTROSURGICAL) ×3
ELECTRODE REM PT RTRN 9FT ADLT (ELECTROSURGICAL) ×2 IMPLANT
FORMALIN 10 PREFIL 120ML (MISCELLANEOUS) IMPLANT
GLOVE BIO SURGEON STRL SZ7.5 (GLOVE) ×3 IMPLANT
GOWN STRL REIN XL XLG (GOWN DISPOSABLE) ×9 IMPLANT
INST SET MINOR GENERAL (KITS) ×3 IMPLANT
KIT ROOM TURNOVER APOR (KITS) ×3 IMPLANT
MANIFOLD NEPTUNE II (INSTRUMENTS) ×3 IMPLANT
MESH HERNIA 1.6X1.9 PLUG LRG (Mesh General) ×2 IMPLANT
MESH HERNIA PLUG LRG (Mesh General) ×1 IMPLANT
NEEDLE HYPO 25X1 1.5 SAFETY (NEEDLE) ×3 IMPLANT
NS IRRIG 1000ML POUR BTL (IV SOLUTION) ×3 IMPLANT
PACK MINOR (CUSTOM PROCEDURE TRAY) ×3 IMPLANT
PAD ARMBOARD 7.5X6 YLW CONV (MISCELLANEOUS) ×3 IMPLANT
SET BASIN LINEN APH (SET/KITS/TRAYS/PACK) ×3 IMPLANT
SOL PREP PROV IODINE SCRUB 4OZ (MISCELLANEOUS) ×3 IMPLANT
SPONGE GAUZE 2X2 8PLY STRL LF (GAUZE/BANDAGES/DRESSINGS) ×3 IMPLANT
STAPLER VISISTAT (STAPLE) ×3 IMPLANT
SUT ETHIBOND NAB MO 7 #0 18IN (SUTURE) ×3 IMPLANT
SUT NOVA NAB GS-22 2 2-0 T-19 (SUTURE) ×9 IMPLANT
SUT NOVAFIL NAB HGS22 2-0 30IN (SUTURE) IMPLANT
SUT SILK 3 0 (SUTURE)
SUT SILK 3-0 18XBRD TIE 12 (SUTURE) IMPLANT
SUT VIC AB 2-0 CT1 27 (SUTURE) ×1
SUT VIC AB 2-0 CT1 TAPERPNT 27 (SUTURE) ×2 IMPLANT
SUT VIC AB 2-0 CT2 27 (SUTURE) ×6 IMPLANT
SUT VIC AB 3-0 SH 27 (SUTURE) ×2
SUT VIC AB 3-0 SH 27X BRD (SUTURE) ×4 IMPLANT
SUT VIC AB 4-0 PS2 27 (SUTURE) ×3 IMPLANT
SUT VICRYL AB 3 0 TIES (SUTURE) IMPLANT
SYR CONTROL 10ML LL (SYRINGE) ×3 IMPLANT
TAPE CLOTH SURG 4X10 WHT LF (GAUZE/BANDAGES/DRESSINGS) ×3 IMPLANT
TOWEL OR 17X26 4PK STRL BLUE (TOWEL DISPOSABLE) ×3 IMPLANT

## 2012-06-10 NOTE — Anesthesia Procedure Notes (Signed)
Procedure Name: Intubation Date/Time: 06/10/2012 12:28 PM Performed by: Glynn Octave E Pre-anesthesia Checklist: Patient identified, Patient being monitored, Timeout performed, Emergency Drugs available and Suction available Patient Re-evaluated:Patient Re-evaluated prior to inductionOxygen Delivery Method: Circle System Utilized Preoxygenation: Pre-oxygenation with 100% oxygen Intubation Type: IV induction, Rapid sequence and Cricoid Pressure applied Ventilation: Mask ventilation without difficulty Laryngoscope Size: Mac and 3 Grade View: Grade I Tube type: Oral Tube size: 7.0 mm Number of attempts: 1 Airway Equipment and Method: stylet Placement Confirmation: ETT inserted through vocal cords under direct vision,  positive ETCO2 and breath sounds checked- equal and bilateral Secured at: 21 cm Tube secured with: Tape Dental Injury: Teeth and Oropharynx as per pre-operative assessment

## 2012-06-10 NOTE — Anesthesia Postprocedure Evaluation (Signed)
  Anesthesia Post-op Note  Patient: Jimmy Durham  Procedure(s) Performed: Procedure(s) with comments: HERNIA REPAIR INGUINAL ADULT (Left) - end 1311 HERNIA REPAIR UMBILICAL ADULT (N/A) - start 1314  Patient Location: PACU  Anesthesia Type:General  Level of Consciousness: awake, alert  and oriented  Airway and Oxygen Therapy: Patient Spontanous Breathing and Patient connected to face mask oxygen  Post-op Pain: moderate  Post-op Assessment: Post-op Vital signs reviewed, Patient's Cardiovascular Status Stable, Respiratory Function Stable, Patent Airway and No signs of Nausea or vomiting  Post-op Vital Signs: Reviewed and stable  Complications: No apparent anesthesia complications

## 2012-06-10 NOTE — Anesthesia Preprocedure Evaluation (Addendum)
Anesthesia Evaluation  Patient identified by MRN, date of birth, ID band Patient awake    Reviewed: Allergy & Precautions, H&P , NPO status , Patient's Chart, lab work & pertinent test results  Airway Mallampati: II TM Distance: >3 FB     Dental  (+) Edentulous Upper and Edentulous Lower   Pulmonary sleep apnea (no cpap) ,  breath sounds clear to auscultation        Cardiovascular hypertension (borderline, taken off meds), Rhythm:Regular Rate:Normal     Neuro/Psych PSYCHIATRIC DISORDERS Anxiety    GI/Hepatic PUD, GERD-  Medicated,  Endo/Other    Renal/GU      Musculoskeletal   Abdominal   Peds  Hematology   Anesthesia Other Findings   Reproductive/Obstetrics                           Anesthesia Physical Anesthesia Plan  ASA: II  Anesthesia Plan: General   Post-op Pain Management:    Induction: Intravenous, Rapid sequence and Cricoid pressure planned  Airway Management Planned: Oral ETT  Additional Equipment:   Intra-op Plan:   Post-operative Plan: Extubation in OR  Informed Consent: I have reviewed the patients History and Physical, chart, labs and discussed the procedure including the risks, benefits and alternatives for the proposed anesthesia with the patient or authorized representative who has indicated his/her understanding and acceptance.     Plan Discussed with:   Anesthesia Plan Comments:         Anesthesia Quick Evaluation

## 2012-06-10 NOTE — Op Note (Signed)
Patient:  Jimmy Durham  DOB:  [birth year]  MRN:  [id number]   Preop Diagnosis:  Left inguinal hernia, umbilical hernia  Postop Diagnosis:  Same  Procedure:  Left inguinal herniorrhaphy, umbilical herniorrhaphy  Surgeon:  [name], M.D.  Anes:  General endotracheal  Indications:  Patient is a 42 year old white male who presents with both a symptomatic left inguinal hernia as well as umbilical hernia. The risks and benefits of both procedures including bleeding, infection, and the possibility recurrence of the hernias were fully explained to the patient, who gave informed consent.  Procedure note:  The patient was placed in the supine position. After induction of general endotracheal anesthesia, the abdomen was prepped and draped using usual sterile technique with DuraPrep. Surgical site confirmation was performed.  A left groin incision was made down to the external oblique aponeuroses. The aponeuroses was incised to the external ring. A Penrose drain was placed around the spermatic cord. The vase deferens was noted within the spermatic cord. No indirect hernia was seen. A direct hernia sac was found. This was incised at its base and inverted. A large PerFix plug was then inserted and secured circumferentially to the transversalis fascia using 2-0 Novafil interrupted sutures. An onlay patch was then placed along the floor of inguinal canal and secured superiorly to the conjoined tendon and inferiorly to the shelving edge of Poupart's ligament using 2-0 Novafil interrupted sutures. The internal ring was recreated using a 2-0 Novafil interrupted suture. The external oblique aponeuroses was reapproximated using a running 2-0 Vicryl suture. The subcutaneous layer was reapproximated using 3-0 Vicryl interrupted suture. The skin was closed a 4-0 Vicryl subcuticular suture. 0.5% Sensorcaine was instilled the surrounding wound. Dermabond was then applied.  Next, I turned my attention to the  umbilical hernia. The umbilicus was freed away from the underlying fascia after an infraumbilical incision was made. Properitoneal fat was noted within the hernia. This was excised and reduced without difficulty. The defect measured less than 1 cm in its greatest diameter. The fascial defect was closed using 0 Ethibond interrupted sutures. The base the umbilicus was secured back to the fascia using a 2-0 Vicryl interrupted suture. The subcutaneous layer was reapproximated using 3-0 Vicryl interrupted suture. The skin was closed using staples. 0.5% Sensorcaine was instilled the surrounding wound. Betadine ointment and dressed a dressing were applied.  All tape and needle counts were correct at the end of both procedures. The patient was extubated in the operating room and transferred to PACU in stable condition.  Complications:  None  EBL:  Minimal  Specimen:  None

## 2012-06-10 NOTE — Transfer of Care (Signed)
Immediate Anesthesia Transfer of Care Note  Patient: Jimmy Durham  Procedure(s) Performed: Procedure(s) with comments: HERNIA REPAIR INGUINAL ADULT (Left) - end 1311 HERNIA REPAIR UMBILICAL ADULT (N/A) - start 1314  Patient Location: PACU  Anesthesia Type:General  Level of Consciousness: awake, alert  and oriented  Airway & Oxygen Therapy: Patient Spontanous Breathing and Patient connected to face mask oxygen  Post-op Assessment: Report given to PACU RN  Post vital signs: Reviewed and stable  Complications: No apparent anesthesia complications

## 2012-06-10 NOTE — Interval H&P Note (Signed)
History and Physical Interval Note:  06/10/2012 11:41 AM  Jimmy Durham  has presented today for surgery, with the diagnosis of left inguinal hernia and umbilical hernia  The various methods of treatment have been discussed with the patient and family. After consideration of risks, benefits and other options for treatment, the patient has consented to  Procedure(s): HERNIA REPAIR INGUINAL ADULT (Left) HERNIA REPAIR UMBILICAL ADULT (N/A) as a surgical intervention .  The patient's history has been reviewed, patient examined, no change in status, stable for surgery.  I have reviewed the patient's chart and labs.  Questions were answered to the patient's satisfaction.     [name] A

## 2012-06-11 ENCOUNTER — Encounter (HOSPITAL_COMMUNITY): Payer: Self-pay | Admitting: General Surgery

## 2012-06-11 NOTE — Progress Notes (Signed)
Patient instructed to call [facility] office or go to ER today for uncontrolled pain and difficulty voiding.  He stated he is urinating infrequently and urine is brown in color.  Pain is in groin and genital area  .  He also stated his pain is 10/10  o n pain scale with percocet.  Denies fever.  Also stated  His  abdomen is swollen and is constipated. h e has been drinking plenty of fluids per pt. statement.  Denies nausea or vomiting.

## 2012-06-19 IMAGING — CT CT ABD-PELV W/ CM
2 of 3 series · 17 of 46 positions shown, 19 images · IV contrast (Omnipaque 300)
Comparison: 09/11/2009.

CLINICAL DATA: Right-sided pain.  Groin pain.Abdominal pain.

CT ABDOMEN AND PELVIS WITH CONTRAST
TECHNIQUE: Multidetector CT imaging of the abdomen and pelvis was
performed following the standard protocol during bolus
administration of intravenous contrast.
Contrast: 100 ml 1mnipaque-EKK.

[Series 2: abd_pel_with 5.0 b40f · axial · 0.71mm/px · z∈[-460,-60]mm · 14 of 92 slices shown, 16 images]
[im 6/92  soft-tissue]
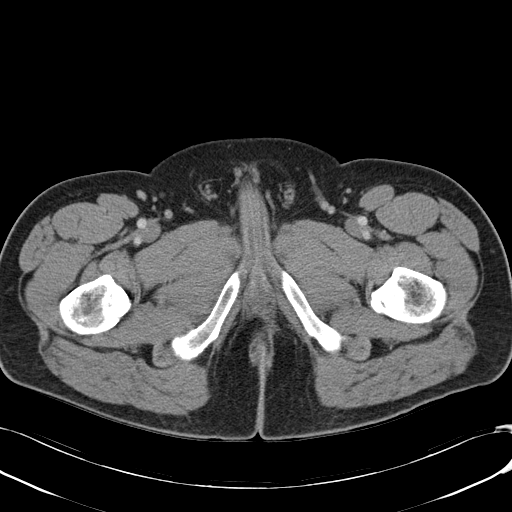
[im 6/92  bone]
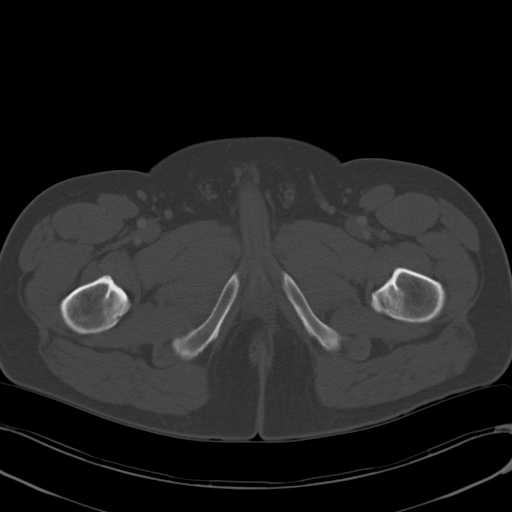
[im 12/92  soft-tissue]
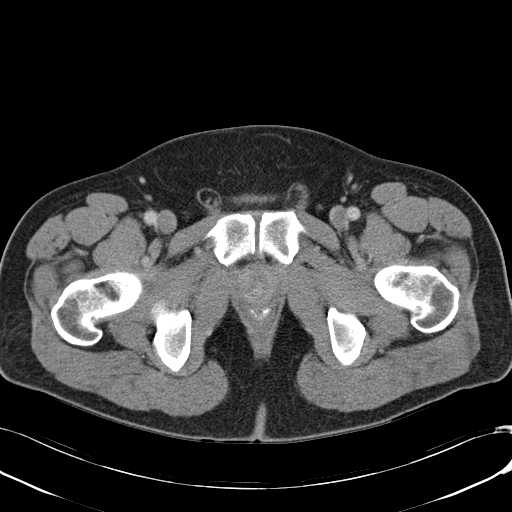
[im 18/92  soft-tissue]
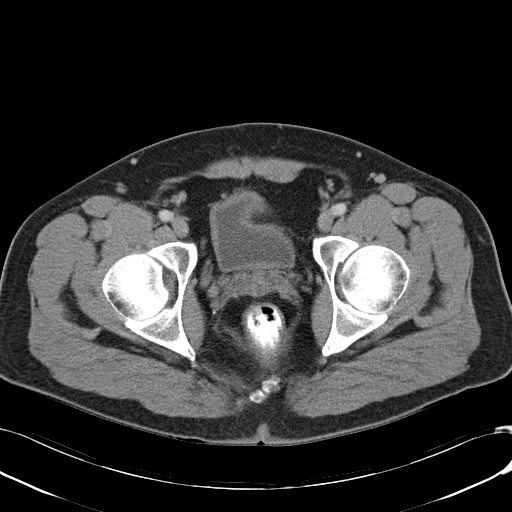
[im 24/92  soft-tissue]
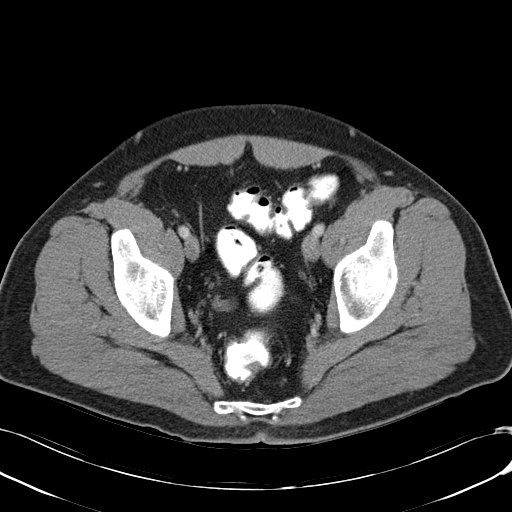
[im 30/92  soft-tissue]
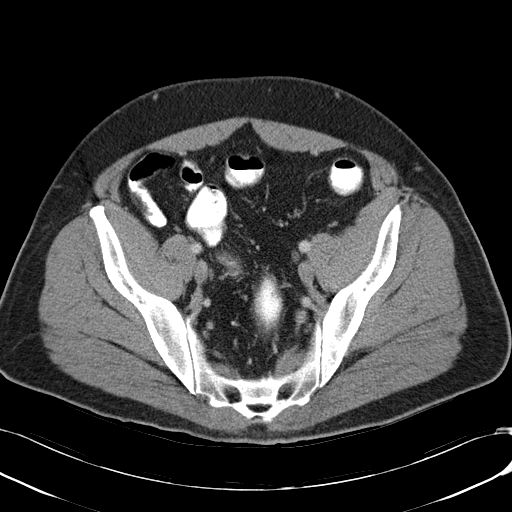
[im 36/92  soft-tissue]
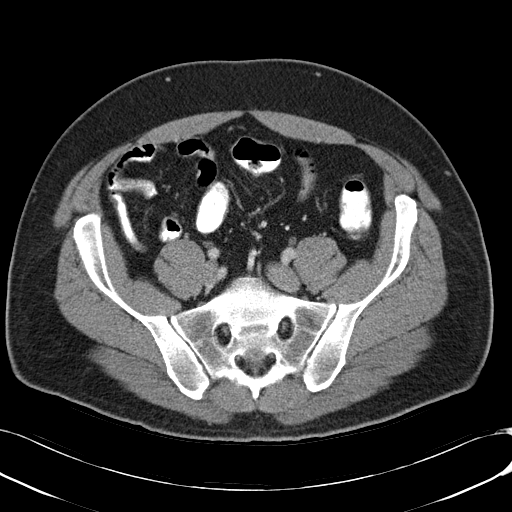
[im 42/92  soft-tissue]
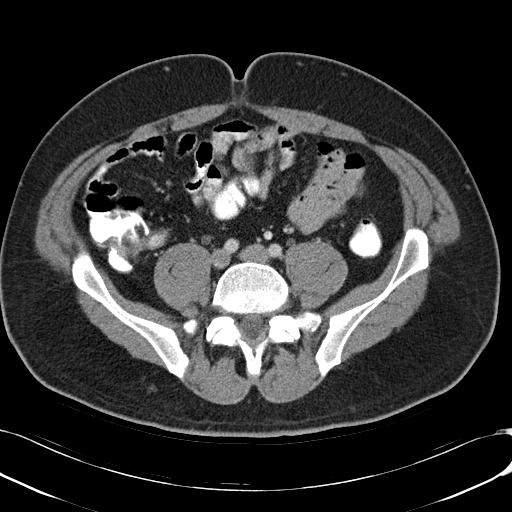
[im 50/92  soft-tissue]
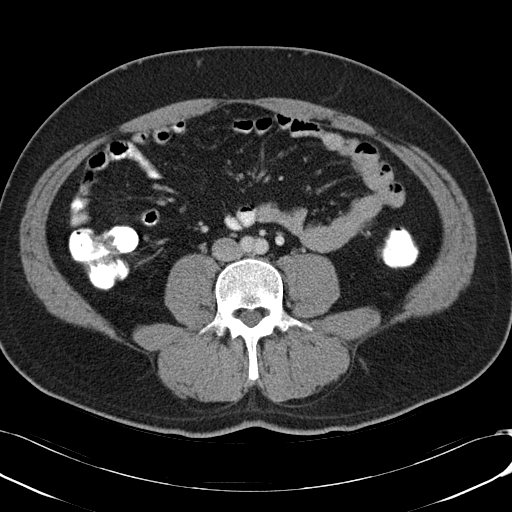
[im 56/92  soft-tissue]
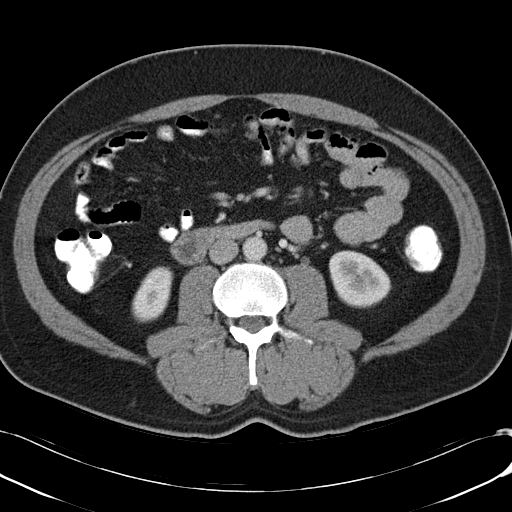
[im 56/92  bone]
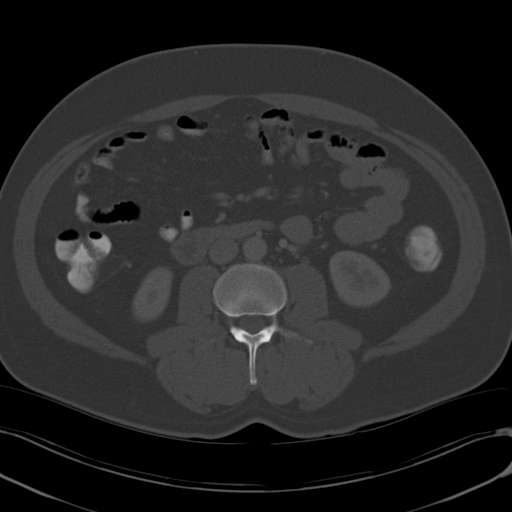
[im 62/92  soft-tissue]
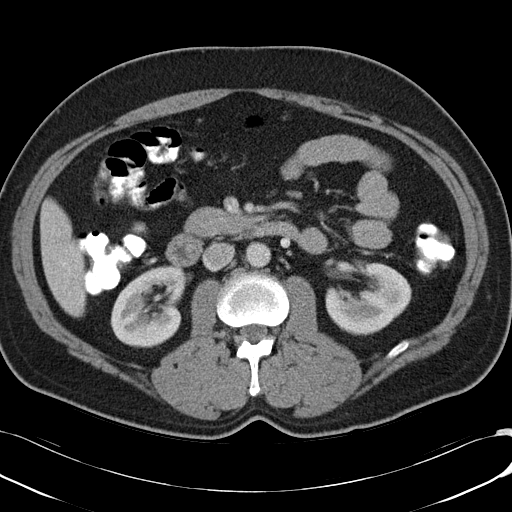
[im 68/92  soft-tissue]
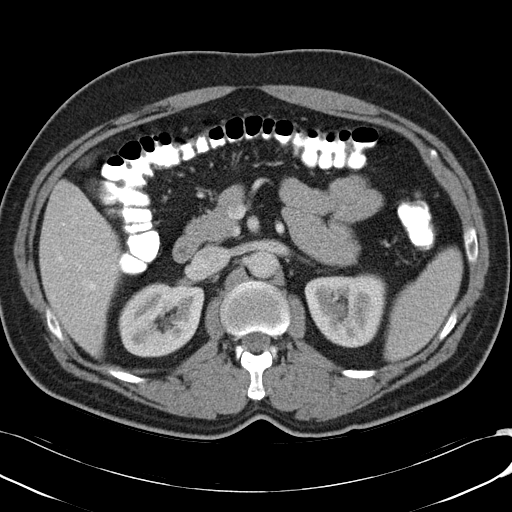
[im 74/92  soft-tissue]
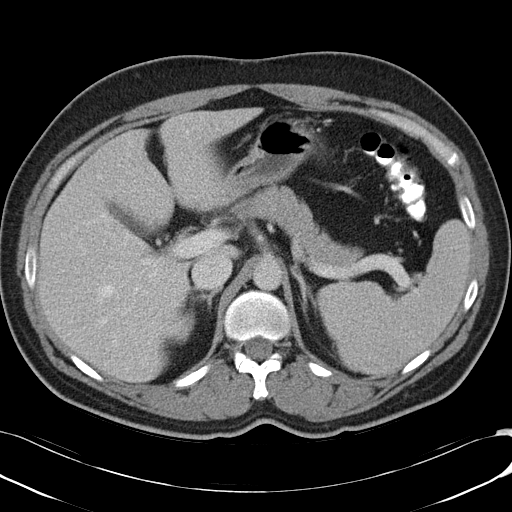
[im 80/92  soft-tissue]
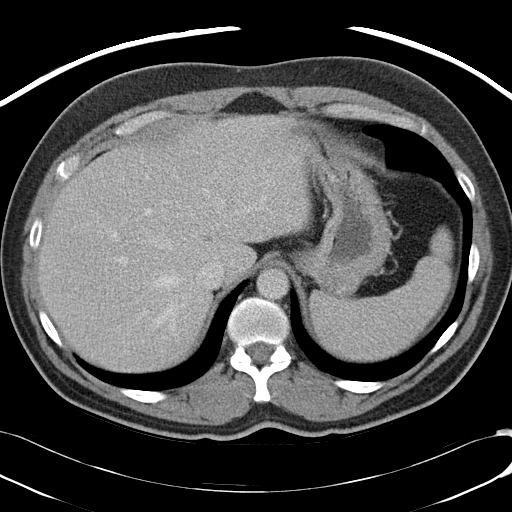
[im 86/92  soft-tissue]
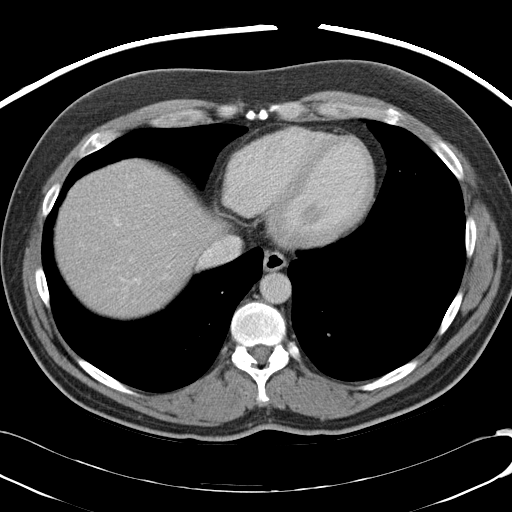

[Series 4: abd_pel_with 3.0 spo cor · coronal · 0.71mm/px · 3 of 90 slices shown]
[im 30/90  soft-tissue]
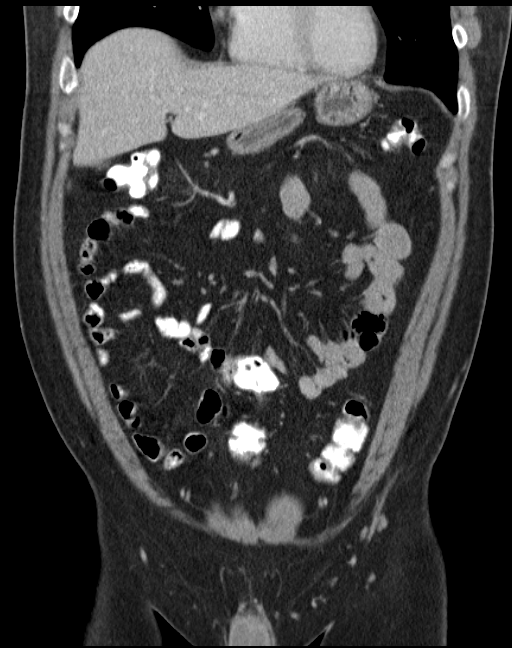
[im 40/90  soft-tissue]
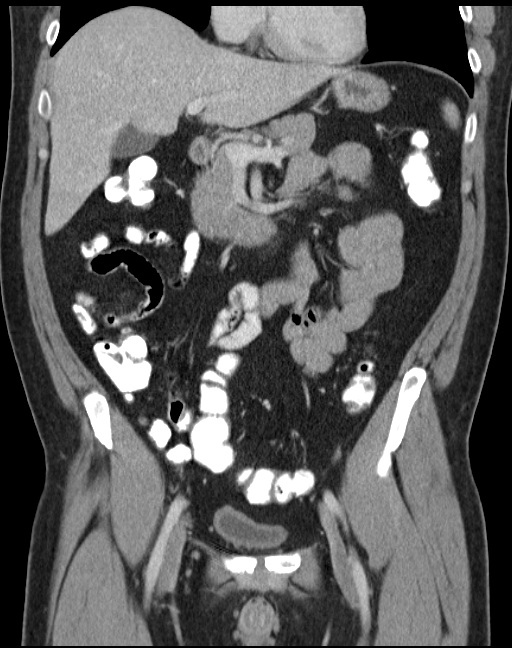
[im 50/90  soft-tissue]
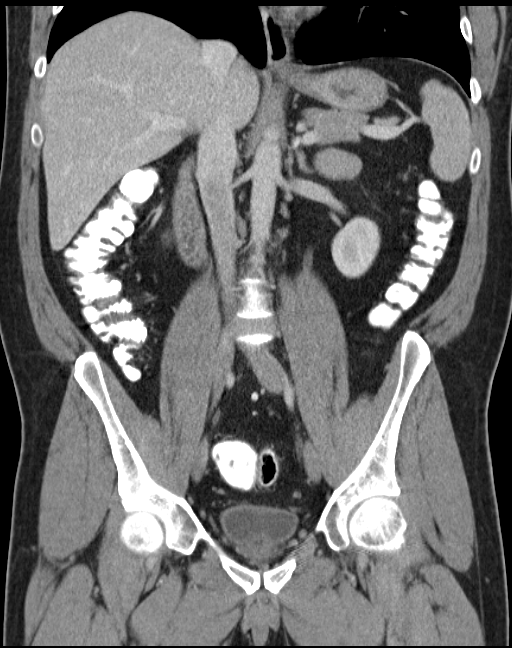

[17 of 46 positions shown; findings below may reference images not displayed]

FINDINGS: The lung bases appear clear.  Liver, gallbladder and
spleen appear normal.  Both adrenal glands are unremarkable.
Pancreas and common bile duct appear normal.  The stomach and small
bowel unremarkable.  Normal renal enhancement.  Normal appendix
identified in the right lower quadrant.  Urinary bladder appears
within normal limits.  Mild thickening of the rectosigmoid junction
is probably due to peristalsis.  Bones appear within normal limits.
IMPRESSION: No acute abnormality.

## 2012-08-15 ENCOUNTER — Other Ambulatory Visit: Payer: Self-pay | Admitting: Neurosurgery

## 2012-08-15 DIAGNOSIS — IMO0002 Reserved for concepts with insufficient information to code with codable children: Secondary | ICD-10-CM

## 2012-08-15 DIAGNOSIS — M47817 Spondylosis without myelopathy or radiculopathy, lumbosacral region: Secondary | ICD-10-CM

## 2012-08-15 DIAGNOSIS — M5137 Other intervertebral disc degeneration, lumbosacral region: Secondary | ICD-10-CM

## 2012-08-15 DIAGNOSIS — M4120 Other idiopathic scoliosis, site unspecified: Secondary | ICD-10-CM

## 2012-08-20 ENCOUNTER — Ambulatory Visit: Payer: Medicaid Other | Admitting: Urology

## 2012-09-17 ENCOUNTER — Ambulatory Visit: Payer: Medicaid Other | Admitting: Urology

## 2012-10-07 ENCOUNTER — Encounter (HOSPITAL_COMMUNITY): Payer: Self-pay | Admitting: Pharmacy Technician

## 2012-10-15 ENCOUNTER — Encounter (HOSPITAL_COMMUNITY): Admission: RE | Payer: Self-pay | Source: Ambulatory Visit

## 2012-10-15 ENCOUNTER — Ambulatory Visit (HOSPITAL_COMMUNITY): Admission: RE | Admit: 2012-10-15 | Payer: Medicaid Other | Source: Ambulatory Visit | Admitting: Cardiology

## 2012-10-15 SURGERY — LEFT HEART CATHETERIZATION WITH CORONARY ANGIOGRAM
Anesthesia: LOCAL

## 2012-10-29 ENCOUNTER — Ambulatory Visit: Payer: Medicaid Other | Admitting: Urology

## 2015-11-03 DIAGNOSIS — R609 Edema, unspecified: Secondary | ICD-10-CM | POA: Diagnosis not present

## 2015-11-03 DIAGNOSIS — R1084 Generalized abdominal pain: Secondary | ICD-10-CM | POA: Diagnosis not present

## 2015-11-03 DIAGNOSIS — Z6828 Body mass index (BMI) 28.0-28.9, adult: Secondary | ICD-10-CM | POA: Diagnosis not present

## 2016-01-24 DIAGNOSIS — E782 Mixed hyperlipidemia: Secondary | ICD-10-CM | POA: Diagnosis not present

## 2016-01-24 DIAGNOSIS — Z79899 Other long term (current) drug therapy: Secondary | ICD-10-CM | POA: Diagnosis not present

## 2016-01-24 DIAGNOSIS — R7301 Impaired fasting glucose: Secondary | ICD-10-CM | POA: Diagnosis not present

## 2016-01-26 DIAGNOSIS — Z6828 Body mass index (BMI) 28.0-28.9, adult: Secondary | ICD-10-CM | POA: Diagnosis not present

## 2016-01-26 DIAGNOSIS — E7800 Pure hypercholesterolemia, unspecified: Secondary | ICD-10-CM | POA: Diagnosis not present

## 2016-01-26 DIAGNOSIS — E782 Mixed hyperlipidemia: Secondary | ICD-10-CM | POA: Diagnosis not present

## 2016-01-26 DIAGNOSIS — F411 Generalized anxiety disorder: Secondary | ICD-10-CM | POA: Diagnosis not present

## 2016-07-17 DIAGNOSIS — Z79899 Other long term (current) drug therapy: Secondary | ICD-10-CM | POA: Diagnosis not present

## 2016-07-17 DIAGNOSIS — E782 Mixed hyperlipidemia: Secondary | ICD-10-CM | POA: Diagnosis not present

## 2016-07-17 DIAGNOSIS — R7301 Impaired fasting glucose: Secondary | ICD-10-CM | POA: Diagnosis not present

## 2016-08-16 DIAGNOSIS — M545 Low back pain: Secondary | ICD-10-CM | POA: Diagnosis not present

## 2016-08-16 DIAGNOSIS — M7989 Other specified soft tissue disorders: Secondary | ICD-10-CM | POA: Diagnosis not present

## 2016-08-16 DIAGNOSIS — R7301 Impaired fasting glucose: Secondary | ICD-10-CM | POA: Diagnosis not present

## 2016-08-16 DIAGNOSIS — R55 Syncope and collapse: Secondary | ICD-10-CM | POA: Diagnosis not present

## 2016-08-16 DIAGNOSIS — R609 Edema, unspecified: Secondary | ICD-10-CM | POA: Diagnosis not present

## 2016-08-23 DIAGNOSIS — E785 Hyperlipidemia, unspecified: Secondary | ICD-10-CM | POA: Diagnosis not present

## 2016-08-23 DIAGNOSIS — H539 Unspecified visual disturbance: Secondary | ICD-10-CM | POA: Diagnosis not present

## 2016-08-23 DIAGNOSIS — I6523 Occlusion and stenosis of bilateral carotid arteries: Secondary | ICD-10-CM | POA: Diagnosis not present

## 2016-08-23 DIAGNOSIS — R55 Syncope and collapse: Secondary | ICD-10-CM | POA: Diagnosis not present

## 2016-08-23 DIAGNOSIS — R0602 Shortness of breath: Secondary | ICD-10-CM | POA: Diagnosis not present

## 2016-08-29 DIAGNOSIS — R079 Chest pain, unspecified: Secondary | ICD-10-CM | POA: Diagnosis not present

## 2016-08-29 DIAGNOSIS — R931 Abnormal findings on diagnostic imaging of heart and coronary circulation: Secondary | ICD-10-CM | POA: Diagnosis not present

## 2016-08-29 DIAGNOSIS — R0602 Shortness of breath: Secondary | ICD-10-CM | POA: Diagnosis not present

## 2016-08-29 DIAGNOSIS — R55 Syncope and collapse: Secondary | ICD-10-CM | POA: Diagnosis not present

## 2016-09-04 DIAGNOSIS — F419 Anxiety disorder, unspecified: Secondary | ICD-10-CM | POA: Diagnosis not present

## 2016-09-04 DIAGNOSIS — R0789 Other chest pain: Secondary | ICD-10-CM | POA: Diagnosis not present

## 2016-09-04 DIAGNOSIS — E782 Mixed hyperlipidemia: Secondary | ICD-10-CM | POA: Diagnosis not present

## 2016-09-04 DIAGNOSIS — R55 Syncope and collapse: Secondary | ICD-10-CM | POA: Diagnosis not present

## 2016-09-14 DIAGNOSIS — F4320 Adjustment disorder, unspecified: Secondary | ICD-10-CM | POA: Diagnosis not present

## 2016-09-14 DIAGNOSIS — F411 Generalized anxiety disorder: Secondary | ICD-10-CM | POA: Diagnosis not present

## 2016-09-14 DIAGNOSIS — G629 Polyneuropathy, unspecified: Secondary | ICD-10-CM | POA: Diagnosis not present

## 2016-09-14 DIAGNOSIS — M545 Low back pain: Secondary | ICD-10-CM | POA: Diagnosis not present

## 2016-09-14 DIAGNOSIS — E7800 Pure hypercholesterolemia, unspecified: Secondary | ICD-10-CM | POA: Diagnosis not present

## 2016-10-03 DIAGNOSIS — R55 Syncope and collapse: Secondary | ICD-10-CM | POA: Diagnosis not present

## 2016-10-10 DIAGNOSIS — R55 Syncope and collapse: Secondary | ICD-10-CM | POA: Diagnosis not present

## 2016-10-10 DIAGNOSIS — R0789 Other chest pain: Secondary | ICD-10-CM | POA: Diagnosis not present

## 2016-10-10 DIAGNOSIS — F419 Anxiety disorder, unspecified: Secondary | ICD-10-CM | POA: Diagnosis not present

## 2016-10-10 DIAGNOSIS — R0609 Other forms of dyspnea: Secondary | ICD-10-CM | POA: Diagnosis not present

## 2016-10-19 DIAGNOSIS — R0789 Other chest pain: Secondary | ICD-10-CM | POA: Diagnosis not present

## 2016-10-22 NOTE — H&P (Signed)
OFFICE VISIT NOTES COPIED TO EPIC FOR DOCUMENTATION  . History of Present Illness Jimmy Durham; 10/10/2016 6:34 PM) Patient words: f/u for event monitor results; last office visit 09/05/2016.  The patient is a 46 year old male who presents for a Follow-up for Shortness of breath.  Additional reasons for visit:  Follow-up for Syncope is described as the following: [name] is white male. Patient has history of chronic dizziness, near-syncope and syncope off and on for many years. Patient says that symptoms have worsened for past one month. He becomes dizzy, has sweating, nausea, near fainting and sometimes passes out for a few seconds. .  Patient had cardiac catheterization in July 2011 by Dr. Percival Spanish. It revealed normal coronary arteries and normal right heart pressure. He also had tilt table test in January 2012. It was abnormal, consistent with neurally mediated syncope. Predominantly there was cardioinhibitory response. There was also vasodepressive response. There was no orthostatic hypotension. During the test and after nitroglycerin, he had initial rise in the heart rate followed by fairly abrupt reduction in the heart rate with sinus bradycardia-40/m followed by episodes of second-degree AV block with junctional escape rhythm. The longest pause was 4.5 seconds and was associated with frank syncope.He states that he continues to have episodes of syncope and last episode 1 week ago when he was sitting and stood up and passed out.  Patient has multiple other somatic complaints. He says I have left arm pain and chest pain all the time. There is no relation to exertion. He also feels short of breath all the times. No orthopnea or PND. No history of wheezing. "I have cramps in my legs and I feel heavy in my legs". He feels his legs are heavy and swollen. He states his left arm is chronically weak.  No history of hypertension or diabetes. He has history of  hypertriglyceridemia. He does not smoke or drink alcohol. He denies taking any drugs. He has chronic low backache and has been taking oxycodone. Patient also has history of chronic anxiety and takes Xanax once a day. No history of thyroid problems. No history of CVA.   Problem List/Past Medical Jimmy Durham; 10/10/2016 3:15 PM) Chest pain (R07.9)  EKG 09/30/2012 S. Bradycardia @ 54/min. Leftward axis. IRBBB. No ischemia. Essentially normal EKG. Exercise Myoview stress test 10/06/2009 patient exercise for 6 minutes and 52 seconds, achieved 7.3 METs, small apical ischemia, low risk study with normal ejection fraction. No EKG evidence of ischemia. Hypertriglyceridemia (E78.1)  Labs 04/26/2012: BUN 13, serum creatinine 1.22 with a eGFR 56 mL. CBC was within normal limits, CMP was normal. BS 135m Lipid profile 04/26/2012 total cholesterol 191, triglycerides 443, HDL was 35. LDL could not be cannulated. Anxiety (F41.9)  Bradycardia (R00.1)  Episodic transmission Event Monitor 30 days 09/04/2016: On 09/11/2016, 3.1 Sec sinus arrest, at 2:26 AM. No atrial fibrillation, no other high degree AV block. Preop testing (Z01.818)  Set up for cardiac cath. Syncope and collapse (R55)  05/03/2010- Tilt table test- Abnormal, consistent with neurally mediated syncope. Predominantly there was cardioinhibitory response. There was also vasodepressive response. There was no orthostatic hypotension. During the test and after nitroglycerin, he had initial rise in the heart rate followed by fairly abrupt reduction in the heart rate with sinus bradycardia-40/m followed by episodes of second-degree AV block with junctional escape rhythm. The longest pause was 4.5 seconds and was associated with frank syncope. He also had profound diaphoresis. The symptoms were similar to his usual symptoms of  dizziness and syncope. Echocardiogram-09/02/2016 at [place], [place]-normal study. LVEF 55-60 percent. Atypical chest pain  (R07.89)  Cardiac catheterization-10/26/2009 by Dr. Percival Spanish- normal coronary arteries. Lexiscan Myoview scans-08/29/2016 at [facility] in [place] area of mild reversibility in the apex. It was considered to be low risk scan. Patient had similar findings on stress Cardiolite scans in June 2011 and subsequently had cardiac catheterization in July 2011 which revealed normal coronary arteries. Hyperlipidemia, mixed (E78.2)  07/17/2016-cholesterol-251, triglycerides-516, HDL-32.  Allergies (Jimmy Durham; 10/10/2016 3:15 PM) Codeine Phosphate *ANALGESICS - OPIOID*  unknown reaction PredniSONE (Pak) *CORTICOSTEROIDS*  Rash. TraZODone HCl *ANTIDEPRESSANTS*  Rapid pulse, Vomiting. Penicillins   Family History Jimmy Durham; 10/10/2016 3:15 PM) Mother  no known medical history Father  In poor health. age 73 (had Tia's in his 72's) has COPD Siblings  only child  Social History Jimmy Durham; 10/10/2016 3:15 PM) Current tobacco use  Never smoker. Non Drinker/No Alcohol Use  Marital status  Married. Number of Children  4.  Past Surgical History Jimmy Durham; 10/10/2016 3:15 PM) Hernia Repair [2014]: Tonsillectomy [1983]:  Medication History (Jimmy Durham; 10/10/2016 3:32 PM) ALPRAZolam (1MG Tablet, 1 Oral three times daily, as needed for anxiety) Active. Oxycodone-Acetaminophen (10-325MG Tablet, 1 Oral as needed) Active. Promethazine HCl (25MG Tablet, 1/2 Oral as needed) Active. Furosemide (20MG Tablet, 1 Oral as needed for edema) Active. Vitamin B12 (100MCG Tablet, 1 Oral as needed) Active. Medications Reconciled (verbally with pt; no list or medication present)  Diagnostic Studies History Jimmy Page, Durham; 10/10/2016 6:19 PM) Heart Cath [2010]: at [facility]: Normal coronary arteries, normal LVEF. Normal right heart pressure. 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 Echocardiogram [08/23/2016]: [facility]: Normal LV systolic function, EF 44-03%. No significant valvular abnormality. Normal study. Cardiovascular Stress Test [08/29/2016]: [place] stress test 08/29/2016: Small area of mild reversibility he is noted involving the apex. Normal wall motion. EF 55%. Low risk.    Review of Systems Jimmy Durham; 10/10/2016 6:31 PM) General Not Present- Appetite Loss and Weight Gain. Respiratory Present- Difficulty Breathing on Exertion. Not Present- Chronic Cough and Wakes up from Sleep Wheezing or Short of Breath. Cardiovascular Present- Edema. Not Present- Claudications. Gastrointestinal Not Present- Black, Tarry Stool and Difficulty Swallowing. Musculoskeletal Present- Back Pain. Not Present- Decreased Range of Motion and Muscle Atrophy. Neurological Not Present- Attention Deficit. Psychiatric Not Present- Personality Changes and Suicidal Ideation. Endocrine Not Present- Cold Intolerance and Heat Intolerance. Hematology Not Present- Abnormal Bleeding. All other systems negative  Vitals (Jimmy Durham; 10/10/2016 3:31 PM) 10/10/2016 3:30 PM Weight: 202.25 lb Height: 72in Body Surface Area: 2.14 m Body Mass Index: 27.43 kg/m  Pulse: 69 (Regular)  P.OX: 95% (Room air) BP: 122/80 (Standing, Left Arm, Standard)    10/10/2016 3:29 PM Weight: 202.25 lb Height: 72in Body Surface Area: 2.14 m Body Mass Index: 27.43 kg/m  Pulse: 68 (Regular)  P.OX: 96% (Room air) BP: 128/84 (Sitting, Left Arm, Standard)    10/10/2016 3:23 PM Weight: 202.25 lb Height: 72in Body Surface Area: 2.14 m Body Mass Index: 27.43 kg/m  Pulse: 69 (Regular)  P.OX: 94% (Room air) BP: 138/84 (Supine, Left Arm, Standard)       Physical Exam Jimmy Durham; 10/10/2016 6:36 PM) General Mental Status-Alert. General Appearance-Cooperative and Appears stated  age. Build & Nutrition-Moderately built.  Head and Neck Thyroid Gland Characteristics - normal size and consistency and no palpable nodules.  Chest  and Lung Exam Chest and lung exam reveals -quiet, even and easy respiratory effort with no use of accessory muscles, non-tender and on auscultation, normal breath sounds, no adventitious sounds.  Cardiovascular Cardiovascular examination reveals -normal heart sounds, regular rate and rhythm with no murmurs, carotid auscultation reveals no bruits, abdominal aorta auscultation reveals no bruits and no prominent pulsation, femoral artery auscultation bilaterally reveals normal pulses, no bruits, no thrills and normal pedal pulses bilaterally.  Abdomen Palpation/Percussion Palpation and Percussion of the abdomen reveal - Non Tender and No hepatosplenomegaly.  Peripheral Vascular Lower Extremity Inspection - Bilateral - Inspection Normal. Palpation - Edema - Bilateral - 2+ Pitting edema.  Neurologic Neurologic evaluation reveals -alert and oriented x 3 with no impairment of recent or remote memory. Motor-Grossly intact without any focal deficits.  Musculoskeletal Global Assessment Left Lower Extremity - no deformities, masses or tenderness, no known fractures. Right Lower Extremity - no deformities, masses or tenderness, no known fractures.    Assessment & Plan Jimmy Durham; 10/10/2016 6:35 PM) Atypical chest pain (R07.89) Story: Cardiac catheterization-10/26/2009 by Dr. Percival Spanish- normal coronary arteries. Lexiscan Myoview scans-08/29/2016 at [place] center in [place] area of mild reversibility in the apex. It was considered to be low risk scan. Patient had similar findings on stress Cardiolite scans in June 2011 and subsequently had cardiac catheterization in July 2011 which revealed normal coronary arteries. Impression: EKG 09/04/16: Sinus bradycardia, heart rate-55/m. Incomplete RBBB. Normal QTc Future  Plans 12/24/8336: METABOLIC PANEL, BASIC (25053) - one time 10/16/2016: CBC & PLATELETS (AUTO) (97673) - one time 10/16/2016: PT (PROTHROMBIN TIME) (41937) - one time Dyspnea on exertion (R06.09) Story: Echocardiogram 08/23/2016: Normal LV systolic function, EF 90-24%. No significant valvular abnormality. Normal study. Syncope and collapse (R55) Story: Event Monitor 30 days 09/04/2016: Episodic transmission Event Monitor 09/06/2016: Syncope, chest pain palpitatiosn and dyspnea: NSR. No ST-T changes of ischemia. No heart block. Asymptomatic 3.1 sec V. standstill at 2 am. 148 events recorded by patient: NSR. Impression: 05/03/2010- Tilt table test- Abnormal, consistent with neurally mediated syncope. Predominantly there was cardioinhibitory response. There was also vasodepressive response. There was no orthostatic hypotension. During the test and after nitroglycerin, he had initial rise in the heart rate followed by fairly abrupt reduction in the heart rate with sinus bradycardia-40/m followed by episodes of second-degree AV block with junctional escape rhythm. The longest pause was 4.5 seconds and was associated with frank syncope. He also had profound diaphoresis. The symptoms were similar to his usual symptoms of dizziness and syncope. Anxiety (F41.9) Hyperlipidemia, mixed (E78.2) Current Plans Started Vascepa 1GM, 2 (two) Capsule two times daily with food, #120, 10/10/2016, Ref. x3. Laboratory examination (O97.35) Story: Labs 07/17/2016: Serum glucose 109 mg, BUN 6, creatinine 1.08, eGFR 83 mL, CMP normal, CBC normal, platelets 276. Total cholesterol 251, triglycerides 516, HDL 32. LDL could not be calculated. TSH normal. A1c 5.7%.  Labs 04/26/2012: BUN 13, serum creatinine 1.22 with a eGFR 56 mL. CBC was within normal limits, CMP was normal. BS 127m  Lipid profile 04/26/2012 total cholesterol 191, triglycerides 443, HDL was 35. LDL could not be cannulated. Bilateral leg edema (R60.0) Current  Plans Mechanism of underlying disease process and action of medications discussed with the patient. I discussed primary/secondary prevention and also dietary counceling was done. Extremely difficult patient to comes to me for evaluation of recurrent syncope, but has never had any major injury, chest pain, shortness of breath, I have reviewed his stress test and echocardiogram and carotid duplex that  was done at [facility] in [place], [place]. I feel that the best option is to proceed directly with heart catheterization in view of his persistent symptoms, minimally abnormal nuclear stress test and marked hypertriglyceridemia. He does not smoke or drink alcohol or use any drugs or illicit. Patient appears to be obsessive with chest pain, heart issues.  His leg edema suggests venous insufficiency. He has been taking furosemide on a p.r.n. basis, but may eventually need lower extremity venous insufficiency study. No evidence of pulmonary hypertension by echocardiogram essentially normal echo. With regard to hypertriglyceridemia, I started him onVascepa 2 capsules b.i.d.. Office visit after cardiac catheterization. Schedule for cardiac catheterization, and possible angioplasty. We discussed regarding risks, benefits, alternatives to this including stress testing, CTA and continued medical therapy. Patient wants to proceed. Understands <1-2% risk of death, stroke, MI, urgent CABG, bleeding, infection, renal failure but not limited to these. This was a greater than 40 minute office visit with greater than 50% of the time spent with face-to-face encounter with patient and evaluation of complex medical issues, re-review of external records. I had a hard time getting our of the exam room in spite of spending time and listening to him empathecally.  CC: Jimmy Lien, Durham  Addendum Jimmy Spillers Durham; 10/20/2016 7:35 AM) Labs 10/20/2016: Serum glucose 120 mg, BUN 8, creatinine 1.0, eGFR 19 mL. Potassium  4.3. HB 13.5/HCT 39.7, platelets 278. Pro time normal at 10.2.   Signed by Jimmy Page, Durham (10/10/2016 6:36 PM)

## 2016-10-24 ENCOUNTER — Ambulatory Visit (HOSPITAL_COMMUNITY)
Admission: RE | Admit: 2016-10-24 | Discharge: 2016-10-24 | Disposition: A | Discharge status: Home / Self Care | Payer: BLUE CROSS/BLUE SHIELD | Source: Ambulatory Visit | Attending: Cardiology | Admitting: Cardiology

## 2016-10-24 ENCOUNTER — Encounter (HOSPITAL_COMMUNITY)
Admission: RE | Disposition: A | Discharge status: Home / Self Care | Payer: Self-pay | Source: Ambulatory Visit | Attending: Cardiology

## 2016-10-24 ENCOUNTER — Encounter (HOSPITAL_COMMUNITY): Payer: Self-pay | Admitting: Cardiology

## 2016-10-24 DIAGNOSIS — I441 Atrioventricular block, second degree: Secondary | ICD-10-CM | POA: Diagnosis not present

## 2016-10-24 DIAGNOSIS — R0602 Shortness of breath: Secondary | ICD-10-CM | POA: Insufficient documentation

## 2016-10-24 DIAGNOSIS — R072 Precordial pain: Secondary | ICD-10-CM | POA: Diagnosis not present

## 2016-10-24 DIAGNOSIS — E782 Mixed hyperlipidemia: Secondary | ICD-10-CM | POA: Insufficient documentation

## 2016-10-24 DIAGNOSIS — F419 Anxiety disorder, unspecified: Secondary | ICD-10-CM | POA: Insufficient documentation

## 2016-10-24 DIAGNOSIS — R600 Localized edema: Secondary | ICD-10-CM | POA: Diagnosis not present

## 2016-10-24 HISTORY — PX: RIGHT/LEFT HEART CATH AND CORONARY ANGIOGRAPHY: CATH118266

## 2016-10-24 LAB — POCT I-STAT 3, ART BLOOD GAS (G3+)
ACID-BASE EXCESS: 1 mmol/L (ref 0.0–2.0)
Acid-Base Excess: 2 mmol/L (ref 0.0–2.0)
BICARBONATE: 27 mmol/L (ref 20.0–28.0)
Bicarbonate: 28.9 mmol/L — ABNORMAL HIGH (ref 20.0–28.0)
O2 SAT: 95 %
O2 Saturation: 60 %
PCO2 ART: 53.2 mmHg — AB (ref 32.0–48.0)
PH ART: 7.343 — AB (ref 7.350–7.450)
PO2 ART: 33 mmHg — AB (ref 83.0–108.0)
PO2 ART: 78 mmHg — AB (ref 83.0–108.0)
TCO2: 28 mmol/L (ref 0–100)
TCO2: 31 mmol/L (ref 0–100)
pCO2 arterial: 46.7 mmHg (ref 32.0–48.0)
pH, Arterial: 7.369 (ref 7.350–7.450)

## 2016-10-24 LAB — POCT I-STAT 3, VENOUS BLOOD GAS (G3P V)
Acid-Base Excess: 3 mmol/L — ABNORMAL HIGH (ref 0.0–2.0)
BICARBONATE: 29.1 mmol/L — AB (ref 20.0–28.0)
O2 Saturation: 77 %
PCO2 VEN: 52.1 mmHg (ref 44.0–60.0)
PH VEN: 7.355 (ref 7.250–7.430)
PO2 VEN: 44 mmHg (ref 32.0–45.0)
TCO2: 31 mmol/L (ref 0–100)

## 2016-10-24 SURGERY — RIGHT/LEFT HEART CATH AND CORONARY ANGIOGRAPHY
Anesthesia: LOCAL

## 2016-10-24 MED ORDER — HEPARIN (PORCINE) IN NACL 2-0.9 UNIT/ML-% IJ SOLN
INTRAMUSCULAR | Status: AC | PRN
  Administered 2016-10-24: 1000 mL

## 2016-10-24 MED ORDER — LIDOCAINE HCL (PF) 1 % IJ SOLN
INTRAMUSCULAR | Status: DC | PRN
  Administered 2016-10-24: 1 mL
  Administered 2016-10-24: 2 mL

## 2016-10-24 MED ORDER — SODIUM CHLORIDE 0.9 % WEIGHT BASED INFUSION: 1.0000 mL/kg/h | INTRAVENOUS | Status: DC

## 2016-10-24 MED ORDER — LIDOCAINE HCL (PF) 1 % IJ SOLN
INTRAMUSCULAR | Status: AC
  Filled 2016-10-24: qty 30

## 2016-10-24 MED ORDER — ASPIRIN 81 MG PO CHEW
CHEWABLE_TABLET | ORAL | Status: AC
  Administered 2016-10-24: 81 mg
  Filled 2016-10-24: qty 1

## 2016-10-24 MED ORDER — HYDROMORPHONE HCL 1 MG/ML IJ SOLN
INTRAMUSCULAR | Status: DC | PRN
  Administered 2016-10-24: 0.5 mg via INTRAVENOUS

## 2016-10-24 MED ORDER — SODIUM CHLORIDE 0.9% FLUSH: 3.0000 mL | INTRAVENOUS | Status: DC | PRN

## 2016-10-24 MED ORDER — SODIUM CHLORIDE 0.9 % IV SOLN: 250.0000 mL | INTRAVENOUS | Status: DC | PRN

## 2016-10-24 MED ORDER — HEPARIN (PORCINE) IN NACL 2-0.9 UNIT/ML-% IJ SOLN
INTRAMUSCULAR | Status: AC
  Filled 2016-10-24: qty 1000

## 2016-10-24 MED ORDER — SODIUM CHLORIDE 0.9% FLUSH: 3.0000 mL | Freq: Two times a day (BID) | INTRAVENOUS | Status: DC

## 2016-10-24 MED ORDER — MIDAZOLAM HCL 2 MG/2ML IJ SOLN
INTRAMUSCULAR | Status: DC | PRN
  Administered 2016-10-24: 2 mg via INTRAVENOUS

## 2016-10-24 MED ORDER — MIDAZOLAM HCL 2 MG/2ML IJ SOLN
INTRAMUSCULAR | Status: AC
  Filled 2016-10-24: qty 2

## 2016-10-24 MED ORDER — VERAPAMIL HCL 2.5 MG/ML IV SOLN
INTRA_ARTERIAL | Status: DC | PRN
  Administered 2016-10-24: 7 mL via INTRA_ARTERIAL

## 2016-10-24 MED ORDER — SODIUM CHLORIDE 0.9 % WEIGHT BASED INFUSION
3.0000 mL/kg/h | INTRAVENOUS | Status: DC
  Administered 2016-10-24: 3 mL/kg/h via INTRAVENOUS

## 2016-10-24 MED ORDER — NITROGLYCERIN 1 MG/10 ML FOR IR/CATH LAB
INTRA_ARTERIAL | Status: AC
  Filled 2016-10-24: qty 10

## 2016-10-24 MED ORDER — HYDROMORPHONE HCL 1 MG/ML IJ SOLN
INTRAMUSCULAR | Status: AC
  Filled 2016-10-24: qty 0.5

## 2016-10-24 MED ORDER — IOPAMIDOL (ISOVUE-370) INJECTION 76%
INTRAVENOUS | Status: AC
  Filled 2016-10-24: qty 100

## 2016-10-24 MED ORDER — ASPIRIN 81 MG PO CHEW: 81.0000 mg | CHEWABLE_TABLET | ORAL | Status: DC

## 2016-10-24 MED ORDER — HEPARIN SODIUM (PORCINE) 1000 UNIT/ML IJ SOLN
INTRAMUSCULAR | Status: DC | PRN
  Administered 2016-10-24: 3000 [IU] via INTRAVENOUS

## 2016-10-24 MED ORDER — HEPARIN SODIUM (PORCINE) 1000 UNIT/ML IJ SOLN
INTRAMUSCULAR | Status: AC
  Filled 2016-10-24: qty 1

## 2016-10-24 MED ORDER — IOPAMIDOL (ISOVUE-370) INJECTION 76%
INTRAVENOUS | Status: DC | PRN
  Administered 2016-10-24: 55 mL via INTRA_ARTERIAL

## 2016-10-24 SURGICAL SUPPLY — 12 items
CATH BALLN WEDGE 5F 110CM (CATHETERS) ×2 IMPLANT
CATH OPTITORQUE TIG 4.0 5F (CATHETERS) ×2 IMPLANT
DEVICE RAD COMP TR BAND LRG (VASCULAR PRODUCTS) ×2 IMPLANT
GLIDESHEATH SLEND A-KIT 6F 20G (SHEATH) ×2 IMPLANT
GUIDEWIRE .025 260CM (WIRE) ×2 IMPLANT
GUIDEWIRE INQWIRE 1.5J.035X260 (WIRE) ×1 IMPLANT
INQWIRE 1.5J .035X260CM (WIRE) ×2
KIT HEART LEFT (KITS) ×2 IMPLANT
PACK CARDIAC CATHETERIZATION (CUSTOM PROCEDURE TRAY) ×2 IMPLANT
SHEATH GLIDE SLENDER 4/5FR (SHEATH) ×2 IMPLANT
TRANSDUCER W/STOPCOCK (MISCELLANEOUS) ×2 IMPLANT
TUBING CIL FLEX 10 FLL-RA (TUBING) ×2 IMPLANT

## 2016-10-24 NOTE — Discharge Instructions (Signed)

## 2016-10-24 NOTE — Interval H&P Note (Signed)
History and Physical Interval Note:  10/24/2016 10:45 AM  Jimmy Durham  has presented today for surgery, with the diagnosis of cp  The various methods of treatment have been discussed with the patient and family. After consideration of risks, benefits and other options for treatment, the patient has consented to  Procedure(s): Right/Left Heart Cath and Coronary Angiography (N/A) and possible PCI as a surgical intervention .  The patient's history has been reviewed, patient examined, no change in status, stable for surgery.  I have reviewed the patient's chart and labs.  Questions were answered to the patient's satisfaction.     Yates DecampGANJI, Raylie Maddison

## 2016-11-02 DIAGNOSIS — R0609 Other forms of dyspnea: Secondary | ICD-10-CM | POA: Diagnosis not present

## 2016-11-02 DIAGNOSIS — R55 Syncope and collapse: Secondary | ICD-10-CM | POA: Diagnosis not present

## 2016-11-02 DIAGNOSIS — F419 Anxiety disorder, unspecified: Secondary | ICD-10-CM | POA: Diagnosis not present

## 2016-11-02 DIAGNOSIS — R0789 Other chest pain: Secondary | ICD-10-CM | POA: Diagnosis not present

## 2016-11-09 ENCOUNTER — Other Ambulatory Visit: Payer: Self-pay | Admitting: Cardiology

## 2016-11-09 DIAGNOSIS — I872 Venous insufficiency (chronic) (peripheral): Secondary | ICD-10-CM

## 2016-11-21 ENCOUNTER — Other Ambulatory Visit: Payer: BLUE CROSS/BLUE SHIELD

## 2016-11-28 ENCOUNTER — Ambulatory Visit
Admission: RE | Admit: 2016-11-28 | Discharge: 2016-11-28 | Disposition: A | Discharge status: Home / Self Care | Payer: BLUE CROSS/BLUE SHIELD | Source: Ambulatory Visit | Attending: Cardiology | Admitting: Cardiology

## 2016-11-28 DIAGNOSIS — I872 Venous insufficiency (chronic) (peripheral): Secondary | ICD-10-CM

## 2016-11-28 DIAGNOSIS — R600 Localized edema: Secondary | ICD-10-CM | POA: Diagnosis not present

## 2016-11-28 NOTE — Consult Note (Signed)
Chief Complaint:  Lower extremity cramping, edema, pain, paresthesias, concern for venous disease or insufficiency  Referring Physician(s): Ganji,Jay  History of Present Illness: Jimmy Durham is a 46 y.o. male who is closely followed by his cardiologist. He presents today for evaluation of bilateral intermittent peripheral edema. Review of his venous history performed. No prior treatments for varicose veins or spider veins. No history of DVT, chronic venous disease, ulcerations, nonhealing wounds, or thrombophlebitis.  He is on disability for a back injury. He has a fairly sedentary lifestyle without routine exercise.  He does not take any pain medications for the leg discomfort. Elevation does not improve his symptoms. He describes bilateral lower extremity cramping, peripheral edema, paresthesias, and throbbing.  Past Medical History:  Diagnosis Date  . Anxiety   . Arthritis   . GERD (gastroesophageal reflux disease)   . Hypertension    stopped taking. it made him weak.  . Sleep apnea    mild; does not use CPAP  . Ulcerative colitis [facility](HCC)     Past Surgical History:  Procedure Laterality Date  . CARDIAC CATHETERIZATION    . INGUINAL HERNIA REPAIR Left 06/10/2012   Procedure: HERNIA REPAIR INGUINAL ADULT;  Surgeon: Dalia HeadingMark A Jenkins, MD;  Location: [facility];  Service: General;  Laterality: Left;  end 1311  . RIGHT/LEFT HEART CATH AND CORONARY ANGIOGRAPHY N/A 10/24/2016   Procedure: Right/Left Heart Cath and Coronary Angiography;  Surgeon: Yates DecampGanji, Jay, MD;  [facility];  Service: Cardiovascular;  Laterality: N/A;  . TONSILLECTOMY AND ADENOIDECTOMY    . TOOTH EXTRACTION     All of teeth. Now has dentures.  Marland Kitchen. UMBILICAL HERNIA REPAIR N/A 06/10/2012   Procedure: HERNIA REPAIR UMBILICAL ADULT;  Surgeon: Dalia HeadingMark A Jenkins, MD;  Location: [facility];  Service: General;  Laterality: N/A;  start 1314    Allergies: Codeine; Prednisone; Asa [aspirin]; Ibuprofen; Penicillin g;  Penicillins; Trazodone; and Trazodone and nefazodone  Medications: Prior to Admission medications   Medication Sig Start Date End Date Taking? Authorizing Provider  ALPRAZolam Prudy Feeler(XANAX) 1 MG tablet Take 1 mg by mouth 3 (three) times daily as needed for anxiety.    Yes [provider]  fenofibrate micronized (LOFIBRA) 134 MG capsule Take 134 mg by mouth daily before breakfast.   Yes [provider]  furosemide (LASIX) 20 MG tablet Take 20 mg by mouth daily as needed.   Yes [provider]  oxyCODONE-acetaminophen (PERCOCET) 10-325 MG tablet Take 1 tablet by mouth 3 (three) times daily as needed for pain. 09/13/16  Yes [provider]  promethazine (PHENERGAN) 25 MG tablet Take 25 mg by mouth daily as needed for nausea/vomiting. 08/12/16  Yes [provider]  Icosapent Ethyl (VASCEPA) 1 g CAPS Take 1 g by mouth 2 (two) times daily.    [provider]     No family history on file.  Social History   Social History  . Marital status: Widowed    Spouse name: N/A  . Number of children: N/A  . Years of education: N/A   Social History Main Topics  . Smoking status: Never Smoker  . Smokeless tobacco: Not on file  . Alcohol use No     Comment: rare  . Drug use: No  . Sexual activity: Not on file   Other Topics Concern  . Not on file   Social History Narrative  . No narrative on file      Review of  Systems: A 12 point ROS discussed and pertinent positives are indicated in the HPI above.  All other systems are negative.  Review of Systems  Vital Signs: BP 137/78   Pulse 79   Temp 98.2 F (36.8 C) (Oral)   Resp 16   Ht 6\' 2"  (1.88 m)   Wt 190 lb (86.2 kg)   SpO2 98%   BMI 24.39 kg/m   Physical Exam  Constitutional: He appears well-developed and well-nourished. No distress.  Eyes: Conjunctivae are normal. No scleral icterus.  Musculoskeletal: Normal range of motion. He exhibits no edema, tenderness or deformity.  No  current lower extremity significant edema. Skin intact. No skin lesions. No palpable or visible varicose veins. No significant spider veins. Normal pedal pulses.  Skin: He is not diaphoretic.     Imaging: US Venous Img Lower Bilateral  Result Date: 11/28/2016 CLINICAL DATA:  Lower extremity edema and pain, concern for venous insufficiency. EXAM: BILATERAL LOWER EXTREMITY VENOUS DOPPLER ULTRASOUND TECHNIQUE: Gray-scale sonography with graded compression, as well as color Doppler and duplex ultrasound were performed to evaluate the lower extremity deep venous systems from the level of the common femoral vein and including the common femoral, femoral, profunda femoral, popliteal and calf veins including the posterior tibial, peroneal and gastrocnemius veins when visible. The superficial great saphenous vein was also interrogated. Spectral Doppler was utilized to evaluate flow at rest and with distal augmentation maneuvers in the common femoral, femoral and popliteal veins. COMPARISON:  None. FINDINGS: RIGHT LOWER EXTREMITY Common Femoral Vein: No evidence of thrombus. Normal compressibility, respiratory phasicity and response to augmentation. Saphenofemoral Junction: No evidence of thrombus. Normal compressibility and flow on color Doppler imaging. Negative for venous insufficiency or reflux. Profunda Femoral Vein: No evidence of thrombus. Normal compressibility and flow on color Doppler imaging. Femoral Vein: No evidence of thrombus. Normal compressibility, respiratory phasicity and response to augmentation. Popliteal Vein: No evidence of thrombus. Normal compressibility, respiratory phasicity and response to augmentation. Calf Veins: No evidence of thrombus. Normal compressibility and flow on color Doppler imaging. Superficial Great Saphenous Vein: No evidence of thrombus. Normal compressibility and flow on color Doppler imaging. Negative for venous insufficiency or reflux. Small saphenous vein:  Negative for  venous insufficiency or reflux. Venous Reflux:  None. Other Findings:  None. LEFT LOWER EXTREMITY Common Femoral Vein: No evidence of thrombus. Normal compressibility, respiratory phasicity and response to augmentation. Saphenofemoral Junction: No evidence of thrombus. Normal compressibility and flow on color Doppler imaging. Negative for venous insufficiency or reflux. Profunda Femoral Vein: No evidence of thrombus. Normal compressibility and flow on color Doppler imaging. Femoral Vein: No evidence of thrombus. Normal compressibility, respiratory phasicity and response to augmentation. Popliteal Vein: No evidence of thrombus. Normal compressibility, respiratory phasicity and response to augmentation. Calf Veins: No evidence of thrombus. Normal compressibility and flow on color Doppler imaging. Superficial Great Saphenous Vein: No evidence of thrombus. Normal compressibility and flow on color Doppler imaging. Negative for venous insufficiency or reflux. Small saphenous vein:  Negative for venous insufficiency or reflux. Venous Reflux:  None. Other Findings:  None. IMPRESSION: No evidence of DVT within either lower extremity. Normal superficial saphenous anatomy bilaterally. Negative for venous insufficiency. Electronically Signed   By: Judie Petit.  Derron Pipkins M.D.   On: 11/28/2016 14:40   [place] Rad Eval And Mgmt  Result Date: 11/28/2016 Please refer to "Notes" to see consult details.   Assessment and Plan:  Intermittent nonspecific lower extremity peripheral edema with cramping, pain and throbbing. Venous ultrasound today is normal.  Negative for DVT. Superficial saphenous anatomy is normal. No venous insufficiency or reflux. No subsurface varicosities demonstrated.  Plan: Normal deep and superficial venous anatomy of both lower extremities.  He was encouraged to continue his work up with cardiology.    Electronically Signed: Berdine Dance 11/28/2016, 4:11 PM   I spent a total of  40 Minutes   in face to face in  clinical consultation, greater than 50% of which was counseling/coordinating care for this patient with intermittent edema and leg pain

## 2016-11-30 ENCOUNTER — Other Ambulatory Visit: Payer: BLUE CROSS/BLUE SHIELD

## 2016-12-13 ENCOUNTER — Ambulatory Visit (INDEPENDENT_AMBULATORY_CARE_PROVIDER_SITE_OTHER): Payer: BLUE CROSS/BLUE SHIELD | Admitting: Orthopedic Surgery

## 2016-12-14 DIAGNOSIS — F411 Generalized anxiety disorder: Secondary | ICD-10-CM | POA: Diagnosis not present

## 2016-12-14 DIAGNOSIS — E782 Mixed hyperlipidemia: Secondary | ICD-10-CM | POA: Diagnosis not present

## 2016-12-14 DIAGNOSIS — E7800 Pure hypercholesterolemia, unspecified: Secondary | ICD-10-CM | POA: Diagnosis not present

## 2016-12-14 DIAGNOSIS — F4320 Adjustment disorder, unspecified: Secondary | ICD-10-CM | POA: Diagnosis not present

## 2016-12-14 DIAGNOSIS — M545 Low back pain: Secondary | ICD-10-CM | POA: Diagnosis not present

## 2017-01-26 DIAGNOSIS — E781 Pure hyperglyceridemia: Secondary | ICD-10-CM | POA: Diagnosis not present

## 2017-02-02 DIAGNOSIS — E781 Pure hyperglyceridemia: Secondary | ICD-10-CM | POA: Diagnosis not present

## 2017-02-02 DIAGNOSIS — R0609 Other forms of dyspnea: Secondary | ICD-10-CM | POA: Diagnosis not present

## 2017-02-02 DIAGNOSIS — F419 Anxiety disorder, unspecified: Secondary | ICD-10-CM | POA: Diagnosis not present

## 2017-02-02 DIAGNOSIS — R0789 Other chest pain: Secondary | ICD-10-CM | POA: Diagnosis not present

## 2017-03-14 DIAGNOSIS — E7800 Pure hypercholesterolemia, unspecified: Secondary | ICD-10-CM | POA: Diagnosis not present

## 2017-03-14 DIAGNOSIS — E782 Mixed hyperlipidemia: Secondary | ICD-10-CM | POA: Diagnosis not present

## 2017-03-14 DIAGNOSIS — F411 Generalized anxiety disorder: Secondary | ICD-10-CM | POA: Diagnosis not present

## 2017-03-14 DIAGNOSIS — Z79899 Other long term (current) drug therapy: Secondary | ICD-10-CM | POA: Diagnosis not present

## 2017-03-14 DIAGNOSIS — F4521 Hypochondriasis: Secondary | ICD-10-CM | POA: Diagnosis not present

## 2017-03-14 DIAGNOSIS — R7301 Impaired fasting glucose: Secondary | ICD-10-CM | POA: Diagnosis not present

## 2017-04-03 DIAGNOSIS — M545 Low back pain: Secondary | ICD-10-CM | POA: Diagnosis not present

## 2017-04-03 DIAGNOSIS — E7800 Pure hypercholesterolemia, unspecified: Secondary | ICD-10-CM | POA: Diagnosis not present

## 2017-04-03 DIAGNOSIS — E782 Mixed hyperlipidemia: Secondary | ICD-10-CM | POA: Diagnosis not present

## 2017-04-03 DIAGNOSIS — G629 Polyneuropathy, unspecified: Secondary | ICD-10-CM | POA: Diagnosis not present

## 2017-04-19 ENCOUNTER — Ambulatory Visit (INDEPENDENT_AMBULATORY_CARE_PROVIDER_SITE_OTHER): Payer: BLUE CROSS/BLUE SHIELD | Admitting: Orthopedic Surgery

## 2017-07-13 DIAGNOSIS — E7800 Pure hypercholesterolemia, unspecified: Secondary | ICD-10-CM | POA: Diagnosis not present

## 2017-07-13 DIAGNOSIS — R7301 Impaired fasting glucose: Secondary | ICD-10-CM | POA: Diagnosis not present

## 2017-07-13 DIAGNOSIS — Z79899 Other long term (current) drug therapy: Secondary | ICD-10-CM | POA: Diagnosis not present

## 2017-07-13 DIAGNOSIS — E782 Mixed hyperlipidemia: Secondary | ICD-10-CM | POA: Diagnosis not present

## 2017-07-23 DIAGNOSIS — E782 Mixed hyperlipidemia: Secondary | ICD-10-CM | POA: Diagnosis not present

## 2017-07-23 DIAGNOSIS — E7800 Pure hypercholesterolemia, unspecified: Secondary | ICD-10-CM | POA: Diagnosis not present

## 2017-07-23 DIAGNOSIS — G629 Polyneuropathy, unspecified: Secondary | ICD-10-CM | POA: Diagnosis not present

## 2017-07-23 DIAGNOSIS — M545 Low back pain: Secondary | ICD-10-CM | POA: Diagnosis not present

## 2017-08-02 ENCOUNTER — Ambulatory Visit (INDEPENDENT_AMBULATORY_CARE_PROVIDER_SITE_OTHER): Payer: BLUE CROSS/BLUE SHIELD | Admitting: Orthopedic Surgery

## 2017-08-09 ENCOUNTER — Ambulatory Visit (INDEPENDENT_AMBULATORY_CARE_PROVIDER_SITE_OTHER): Payer: BLUE CROSS/BLUE SHIELD | Admitting: Orthopedic Surgery

## 2017-09-04 DIAGNOSIS — Z79899 Other long term (current) drug therapy: Secondary | ICD-10-CM | POA: Diagnosis not present

## 2017-10-11 DIAGNOSIS — E7800 Pure hypercholesterolemia, unspecified: Secondary | ICD-10-CM | POA: Diagnosis not present

## 2017-10-11 DIAGNOSIS — E782 Mixed hyperlipidemia: Secondary | ICD-10-CM | POA: Diagnosis not present

## 2017-10-11 DIAGNOSIS — Z79899 Other long term (current) drug therapy: Secondary | ICD-10-CM | POA: Diagnosis not present

## 2017-10-11 DIAGNOSIS — E559 Vitamin D deficiency, unspecified: Secondary | ICD-10-CM | POA: Diagnosis not present

## 2017-11-13 DIAGNOSIS — R7301 Impaired fasting glucose: Secondary | ICD-10-CM | POA: Diagnosis not present

## 2017-11-13 DIAGNOSIS — K2900 Acute gastritis without bleeding: Secondary | ICD-10-CM | POA: Diagnosis not present

## 2017-11-13 DIAGNOSIS — E782 Mixed hyperlipidemia: Secondary | ICD-10-CM | POA: Diagnosis not present

## 2017-11-21 DIAGNOSIS — R42 Dizziness and giddiness: Secondary | ICD-10-CM | POA: Diagnosis not present

## 2017-11-21 DIAGNOSIS — R55 Syncope and collapse: Secondary | ICD-10-CM | POA: Diagnosis not present

## 2018-01-16 ENCOUNTER — Encounter

## 2018-01-16 ENCOUNTER — Ambulatory Visit: Payer: BLUE CROSS/BLUE SHIELD | Admitting: Neurology

## 2018-01-17 ENCOUNTER — Encounter: Payer: Self-pay | Admitting: Neurology

## 2018-02-19 DIAGNOSIS — R609 Edema, unspecified: Secondary | ICD-10-CM | POA: Diagnosis not present

## 2018-02-19 DIAGNOSIS — R5382 Chronic fatigue, unspecified: Secondary | ICD-10-CM | POA: Diagnosis not present

## 2018-02-19 DIAGNOSIS — E782 Mixed hyperlipidemia: Secondary | ICD-10-CM | POA: Diagnosis not present

## 2018-02-19 DIAGNOSIS — E559 Vitamin D deficiency, unspecified: Secondary | ICD-10-CM | POA: Diagnosis not present

## 2018-02-19 DIAGNOSIS — R7301 Impaired fasting glucose: Secondary | ICD-10-CM | POA: Diagnosis not present

## 2018-02-19 DIAGNOSIS — E7800 Pure hypercholesterolemia, unspecified: Secondary | ICD-10-CM | POA: Diagnosis not present

## 2018-05-06 ENCOUNTER — Encounter

## 2018-05-06 ENCOUNTER — Ambulatory Visit: Payer: BLUE CROSS/BLUE SHIELD | Admitting: Neurology

## 2018-05-07 ENCOUNTER — Telehealth: Payer: Self-pay | Admitting: Neurology

## 2018-05-07 NOTE — Telephone Encounter (Signed)
Due to 2 new patient NSH we can't see him

## 2018-05-07 NOTE — Telephone Encounter (Signed)
FYI- patient has had 2 new patient no-shows

## 2018-05-24 DIAGNOSIS — R7301 Impaired fasting glucose: Secondary | ICD-10-CM | POA: Diagnosis not present

## 2018-05-24 DIAGNOSIS — E7800 Pure hypercholesterolemia, unspecified: Secondary | ICD-10-CM | POA: Diagnosis not present

## 2018-05-24 DIAGNOSIS — F4521 Hypochondriasis: Secondary | ICD-10-CM | POA: Diagnosis not present

## 2018-05-24 DIAGNOSIS — E782 Mixed hyperlipidemia: Secondary | ICD-10-CM | POA: Diagnosis not present

## 2018-05-27 DIAGNOSIS — R609 Edema, unspecified: Secondary | ICD-10-CM | POA: Diagnosis not present

## 2018-05-27 DIAGNOSIS — M545 Low back pain: Secondary | ICD-10-CM | POA: Diagnosis not present

## 2018-05-27 DIAGNOSIS — F4521 Hypochondriasis: Secondary | ICD-10-CM | POA: Diagnosis not present

## 2018-05-27 DIAGNOSIS — R7301 Impaired fasting glucose: Secondary | ICD-10-CM | POA: Diagnosis not present

## 2018-05-27 DIAGNOSIS — E782 Mixed hyperlipidemia: Secondary | ICD-10-CM | POA: Diagnosis not present

## 2018-06-25 ENCOUNTER — Ambulatory Visit: Payer: BLUE CROSS/BLUE SHIELD | Admitting: Neurology

## 2018-06-26 ENCOUNTER — Telehealth: Payer: Self-pay | Admitting: Neurology

## 2018-06-26 NOTE — Telephone Encounter (Signed)
Patient has had 3 total new patient no-shows at [facility].

## 2018-06-26 NOTE — Telephone Encounter (Signed)
Please don't reschedule

## 2018-07-02 ENCOUNTER — Encounter: Payer: Self-pay | Admitting: Neurology

## 2018-08-20 DIAGNOSIS — E7800 Pure hypercholesterolemia, unspecified: Secondary | ICD-10-CM | POA: Diagnosis not present

## 2018-08-20 DIAGNOSIS — E559 Vitamin D deficiency, unspecified: Secondary | ICD-10-CM | POA: Diagnosis not present

## 2018-08-20 DIAGNOSIS — R7301 Impaired fasting glucose: Secondary | ICD-10-CM | POA: Diagnosis not present

## 2018-08-20 DIAGNOSIS — E782 Mixed hyperlipidemia: Secondary | ICD-10-CM | POA: Diagnosis not present

## 2018-08-22 DIAGNOSIS — R079 Chest pain, unspecified: Secondary | ICD-10-CM | POA: Diagnosis not present

## 2018-08-22 DIAGNOSIS — R55 Syncope and collapse: Secondary | ICD-10-CM | POA: Diagnosis not present

## 2018-08-22 DIAGNOSIS — R7301 Impaired fasting glucose: Secondary | ICD-10-CM | POA: Diagnosis not present

## 2018-08-22 DIAGNOSIS — E782 Mixed hyperlipidemia: Secondary | ICD-10-CM | POA: Diagnosis not present

## 2018-11-22 DIAGNOSIS — R7301 Impaired fasting glucose: Secondary | ICD-10-CM | POA: Diagnosis not present

## 2018-11-22 DIAGNOSIS — M545 Low back pain: Secondary | ICD-10-CM | POA: Diagnosis not present

## 2018-11-22 DIAGNOSIS — F4320 Adjustment disorder, unspecified: Secondary | ICD-10-CM | POA: Diagnosis not present

## 2018-11-22 DIAGNOSIS — E782 Mixed hyperlipidemia: Secondary | ICD-10-CM | POA: Diagnosis not present

## 2018-11-22 DIAGNOSIS — F411 Generalized anxiety disorder: Secondary | ICD-10-CM | POA: Diagnosis not present

## 2019-03-18 ENCOUNTER — Ambulatory Visit: Payer: BLUE CROSS/BLUE SHIELD | Admitting: Orthopedic Surgery

## 2019-03-27 ENCOUNTER — Ambulatory Visit: Payer: BLUE CROSS/BLUE SHIELD | Admitting: Orthopedic Surgery

## 2019-04-03 ENCOUNTER — Ambulatory Visit: Payer: BLUE CROSS/BLUE SHIELD | Admitting: Orthopedic Surgery

## 2021-11-30 DIAGNOSIS — M5136 Other intervertebral disc degeneration, lumbar region: Secondary | ICD-10-CM | POA: Diagnosis not present

## 2021-11-30 DIAGNOSIS — B353 Tinea pedis: Secondary | ICD-10-CM | POA: Diagnosis not present

## 2021-11-30 DIAGNOSIS — M238X2 Other internal derangements of left knee: Secondary | ICD-10-CM | POA: Diagnosis not present

## 2021-11-30 DIAGNOSIS — Z6827 Body mass index (BMI) 27.0-27.9, adult: Secondary | ICD-10-CM | POA: Diagnosis not present

## 2021-11-30 DIAGNOSIS — R69 Illness, unspecified: Secondary | ICD-10-CM | POA: Diagnosis not present

## 2021-11-30 DIAGNOSIS — E7849 Other hyperlipidemia: Secondary | ICD-10-CM | POA: Diagnosis not present

## 2021-11-30 DIAGNOSIS — R7301 Impaired fasting glucose: Secondary | ICD-10-CM | POA: Diagnosis not present

## 2022-03-23 DIAGNOSIS — Z6827 Body mass index (BMI) 27.0-27.9, adult: Secondary | ICD-10-CM | POA: Diagnosis not present

## 2022-03-23 DIAGNOSIS — R7301 Impaired fasting glucose: Secondary | ICD-10-CM | POA: Diagnosis not present

## 2022-03-23 DIAGNOSIS — G4700 Insomnia, unspecified: Secondary | ICD-10-CM | POA: Diagnosis not present

## 2022-03-23 DIAGNOSIS — Z23 Encounter for immunization: Secondary | ICD-10-CM | POA: Diagnosis not present

## 2022-03-23 DIAGNOSIS — F4310 Post-traumatic stress disorder, unspecified: Secondary | ICD-10-CM | POA: Diagnosis not present

## 2022-03-23 DIAGNOSIS — F4320 Adjustment disorder, unspecified: Secondary | ICD-10-CM | POA: Diagnosis not present

## 2022-03-23 DIAGNOSIS — R030 Elevated blood-pressure reading, without diagnosis of hypertension: Secondary | ICD-10-CM | POA: Diagnosis not present

## 2022-03-23 DIAGNOSIS — E559 Vitamin D deficiency, unspecified: Secondary | ICD-10-CM | POA: Diagnosis not present

## 2022-03-23 DIAGNOSIS — E7849 Other hyperlipidemia: Secondary | ICD-10-CM | POA: Diagnosis not present

## 2022-03-23 DIAGNOSIS — M5136 Other intervertebral disc degeneration, lumbar region: Secondary | ICD-10-CM | POA: Diagnosis not present

## 2022-03-23 DIAGNOSIS — F4521 Hypochondriasis: Secondary | ICD-10-CM | POA: Diagnosis not present

## 2022-09-12 DIAGNOSIS — E782 Mixed hyperlipidemia: Secondary | ICD-10-CM | POA: Diagnosis not present

## 2022-09-12 DIAGNOSIS — E559 Vitamin D deficiency, unspecified: Secondary | ICD-10-CM | POA: Diagnosis not present

## 2022-09-12 DIAGNOSIS — R7301 Impaired fasting glucose: Secondary | ICD-10-CM | POA: Diagnosis not present

## 2022-10-24 DIAGNOSIS — E7849 Other hyperlipidemia: Secondary | ICD-10-CM | POA: Diagnosis not present

## 2022-10-24 DIAGNOSIS — R7301 Impaired fasting glucose: Secondary | ICD-10-CM | POA: Diagnosis not present

## 2022-10-24 DIAGNOSIS — F4521 Hypochondriasis: Secondary | ICD-10-CM | POA: Diagnosis not present

## 2022-10-24 DIAGNOSIS — M5136 Other intervertebral disc degeneration, lumbar region: Secondary | ICD-10-CM | POA: Diagnosis not present

## 2023-01-17 DIAGNOSIS — E7849 Other hyperlipidemia: Secondary | ICD-10-CM | POA: Diagnosis not present

## 2023-01-17 DIAGNOSIS — E781 Pure hyperglyceridemia: Secondary | ICD-10-CM | POA: Diagnosis not present

## 2023-01-17 DIAGNOSIS — E7801 Familial hypercholesterolemia: Secondary | ICD-10-CM | POA: Diagnosis not present

## 2023-03-01 DIAGNOSIS — R030 Elevated blood-pressure reading, without diagnosis of hypertension: Secondary | ICD-10-CM | POA: Diagnosis not present

## 2023-03-01 DIAGNOSIS — M545 Low back pain, unspecified: Secondary | ICD-10-CM | POA: Diagnosis not present

## 2023-03-01 DIAGNOSIS — Z6827 Body mass index (BMI) 27.0-27.9, adult: Secondary | ICD-10-CM | POA: Diagnosis not present

## 2023-11-22 ENCOUNTER — Ambulatory Visit: Admitting: General Surgery

## 2023-12-18 ENCOUNTER — Ambulatory Visit: Admitting: General Surgery

## 2024-01-03 ENCOUNTER — Ambulatory Visit: Admitting: General Surgery
